# Patient Record
Sex: Female | Born: 1960 | Race: White | Hispanic: No | Smoking: Current every day smoker
Health system: Southern US, Community
[De-identification: ages and names within clinical notes are randomized; demographics above are authoritative.]

## PROBLEM LIST (undated history)

## (undated) DIAGNOSIS — J449 Chronic obstructive pulmonary disease, unspecified: Secondary | ICD-10-CM

## (undated) DIAGNOSIS — G43909 Migraine, unspecified, not intractable, without status migrainosus: Secondary | ICD-10-CM

## (undated) DIAGNOSIS — G8929 Other chronic pain: Secondary | ICD-10-CM

## (undated) DIAGNOSIS — K859 Acute pancreatitis without necrosis or infection, unspecified: Secondary | ICD-10-CM

## (undated) DIAGNOSIS — I10 Essential (primary) hypertension: Secondary | ICD-10-CM

## (undated) DIAGNOSIS — K219 Gastro-esophageal reflux disease without esophagitis: Secondary | ICD-10-CM

## (undated) DIAGNOSIS — M1990 Unspecified osteoarthritis, unspecified site: Secondary | ICD-10-CM

## (undated) DIAGNOSIS — E785 Hyperlipidemia, unspecified: Secondary | ICD-10-CM

## (undated) DIAGNOSIS — M549 Dorsalgia, unspecified: Secondary | ICD-10-CM

## (undated) DIAGNOSIS — F419 Anxiety disorder, unspecified: Secondary | ICD-10-CM

## (undated) DIAGNOSIS — M797 Fibromyalgia: Secondary | ICD-10-CM

## (undated) DIAGNOSIS — K8020 Calculus of gallbladder without cholecystitis without obstruction: Secondary | ICD-10-CM

## (undated) DIAGNOSIS — F32A Depression, unspecified: Secondary | ICD-10-CM

## (undated) DIAGNOSIS — F329 Major depressive disorder, single episode, unspecified: Secondary | ICD-10-CM

## (undated) HISTORY — PX: WISDOM TOOTH EXTRACTION: SHX21

## (undated) HISTORY — DX: Acute pancreatitis without necrosis or infection, unspecified: K85.90

## (undated) HISTORY — DX: Essential (primary) hypertension: I10

## (undated) HISTORY — DX: Gastro-esophageal reflux disease without esophagitis: K21.9

## (undated) HISTORY — DX: Chronic obstructive pulmonary disease, unspecified: J44.9

## (undated) HISTORY — DX: Anxiety disorder, unspecified: F41.9

## (undated) HISTORY — DX: Calculus of gallbladder without cholecystitis without obstruction: K80.20

## (undated) HISTORY — DX: Hyperlipidemia, unspecified: E78.5

## (undated) HISTORY — DX: Fibromyalgia: M79.7

## (undated) HISTORY — PX: CHOLECYSTECTOMY: SHX55

---

## 2015-10-17 ENCOUNTER — Emergency Department (HOSPITAL_COMMUNITY)
Admission: EM | Admit: 2015-10-17 | Discharge: 2015-10-17 | Disposition: A | Discharge status: Home / Self Care | Payer: Medicaid Other | Attending: Emergency Medicine | Admitting: Emergency Medicine

## 2015-10-17 ENCOUNTER — Encounter (HOSPITAL_COMMUNITY): Payer: Self-pay | Admitting: Emergency Medicine

## 2015-10-17 ENCOUNTER — Emergency Department (HOSPITAL_COMMUNITY)
Admission: EM | Admit: 2015-10-17 | Discharge: 2015-10-17 | Discharge status: Against Medical Advice | Payer: Medicaid Other | Attending: Emergency Medicine | Admitting: Emergency Medicine

## 2015-10-17 DIAGNOSIS — E119 Type 2 diabetes mellitus without complications: Secondary | ICD-10-CM | POA: Diagnosis not present

## 2015-10-17 DIAGNOSIS — F329 Major depressive disorder, single episode, unspecified: Secondary | ICD-10-CM | POA: Diagnosis not present

## 2015-10-17 DIAGNOSIS — F17200 Nicotine dependence, unspecified, uncomplicated: Secondary | ICD-10-CM | POA: Diagnosis not present

## 2015-10-17 DIAGNOSIS — H53149 Visual discomfort, unspecified: Secondary | ICD-10-CM | POA: Diagnosis not present

## 2015-10-17 DIAGNOSIS — I10 Essential (primary) hypertension: Secondary | ICD-10-CM | POA: Insufficient documentation

## 2015-10-17 DIAGNOSIS — Z7982 Long term (current) use of aspirin: Secondary | ICD-10-CM | POA: Diagnosis not present

## 2015-10-17 DIAGNOSIS — R51 Headache: Secondary | ICD-10-CM | POA: Insufficient documentation

## 2015-10-17 DIAGNOSIS — M1990 Unspecified osteoarthritis, unspecified site: Secondary | ICD-10-CM | POA: Insufficient documentation

## 2015-10-17 DIAGNOSIS — R110 Nausea: Secondary | ICD-10-CM | POA: Diagnosis not present

## 2015-10-17 DIAGNOSIS — R519 Headache, unspecified: Secondary | ICD-10-CM

## 2015-10-17 DIAGNOSIS — Z7984 Long term (current) use of oral hypoglycemic drugs: Secondary | ICD-10-CM | POA: Diagnosis not present

## 2015-10-17 DIAGNOSIS — Z79899 Other long term (current) drug therapy: Secondary | ICD-10-CM | POA: Insufficient documentation

## 2015-10-17 HISTORY — DX: Unspecified osteoarthritis, unspecified site: M19.90

## 2015-10-17 HISTORY — DX: Depression, unspecified: F32.A

## 2015-10-17 HISTORY — DX: Essential (primary) hypertension: I10

## 2015-10-17 HISTORY — DX: Major depressive disorder, single episode, unspecified: F32.9

## 2015-10-17 MED ORDER — DIPHENHYDRAMINE HCL 50 MG/ML IJ SOLN
25.0000 mg | Freq: Once | INTRAMUSCULAR | Status: AC
  Administered 2015-10-17: 25 mg via INTRAVENOUS
  Filled 2015-10-17: qty 1

## 2015-10-17 MED ORDER — KETOROLAC TROMETHAMINE 30 MG/ML IJ SOLN
30.0000 mg | Freq: Once | INTRAMUSCULAR | Status: DC
  Filled 2015-10-17: qty 1

## 2015-10-17 MED ORDER — ONDANSETRON 8 MG PO TBDP
8.0000 mg | ORAL_TABLET | Freq: Once | ORAL | Status: AC
  Administered 2015-10-17: 8 mg via ORAL
  Filled 2015-10-17: qty 1

## 2015-10-17 MED ORDER — KETOROLAC TROMETHAMINE 30 MG/ML IJ SOLN
30.0000 mg | Freq: Once | INTRAMUSCULAR | Status: AC
  Administered 2015-10-17: 30 mg via INTRAVENOUS

## 2015-10-17 NOTE — ED Notes (Signed)
Pt left AMA.  Decided she did not want to wait.

## 2015-10-17 NOTE — Discharge Instructions (Signed)

## 2015-10-17 NOTE — ED Provider Notes (Signed)
CSN: [id number]     Arrival date & time 10/17/15  1908 History   First MD Initiated Contact with Patient 10/17/15 2035     Chief Complaint  Patient presents with  . Headache     (Consider location/radiation/quality/duration/timing/severity/associated sxs/prior Treatment) HPI Comments: Patient presents to the emergency department with chief complaint of headache. She reports history of migraine. States that she has had a slowly worsening headache for the past 2 days. She states that she normally takes Excedrin Migraine at home, but has not tried that for this headache. She reports associated nausea, photophobia, and phonophobia. She denies any vomiting. She sustained numbness, weakness, or tingling. There are no other associated symptoms. She is new to the area and has her first appointment with a primary care provider tomorrow.  The history is provided by the patient. No language interpreter was used.    Past Medical History  Diagnosis Date  . Hypertension   . Diabetes mellitus without complication ([place])   . Depression   . DJD (degenerative joint disease)    Past Surgical History  Procedure Laterality Date  . Cholecystectomy     Family History  Problem Relation Age of Onset  . Diabetes Mother   . Hypertension Mother   . Anemia Mother   . Cancer Father   . Cancer Brother    Social History  Substance Use Topics  . Smoking status: Current Every Day Smoker  . Smokeless tobacco: None  . Alcohol Use: No   OB History    No data available     Review of Systems  Constitutional: Negative for fever and chills.  Respiratory: Negative for shortness of breath.   Cardiovascular: Negative for chest pain.  Gastrointestinal: Negative for nausea, vomiting, diarrhea and constipation.  Genitourinary: Negative for dysuria.  Neurological: Positive for headaches.  All other systems reviewed and are negative.     Allergies  Review of patient's allergies indicates no known  allergies.  Home Medications   Prior to Admission medications   Medication Sig Start Date End Date Taking? Authorizing Provider  albuterol (PROVENTIL HFA;VENTOLIN HFA) 108 (90 BASE) MCG/ACT inhaler Inhale 2 puffs into the lungs 4 (four) times daily.   Yes Historical Provider, MD  aspirin EC 81 MG tablet Take 81 mg by mouth daily.   Yes Historical Provider, MD  atorvastatin (LIPITOR) 10 MG tablet Take 10 mg by mouth daily.   Yes Historical Provider, MD  busPIRone (BUSPAR) 15 MG tablet Take 15 mg by mouth 3 (three) times daily.   Yes Historical Provider, MD  cholecalciferol (VITAMIN D) 1000 UNITS tablet Take 2,000 Units by mouth daily.   Yes Historical Provider, MD  DULoxetine (CYMBALTA) 60 MG capsule Take 60 mg by mouth 2 (two) times daily.   Yes Historical Provider, MD  furosemide (LASIX) 40 MG tablet Take 40 mg by mouth daily.   Yes Historical Provider, MD  gabapentin (NEURONTIN) 600 MG tablet Take 600 mg by mouth 3 (three) times daily.   Yes Historical Provider, MD  lisinopril (PRINIVIL,ZESTRIL) 10 MG tablet Take 10 mg by mouth daily.   Yes Historical Provider, MD  Melatonin 3 MG TABS Take by mouth at bedtime as needed (SLEEP).   Yes Historical Provider, MD  metFORMIN (GLUCOPHAGE) 500 MG tablet Take 500 mg by mouth 2 (two) times daily with a meal.   Yes Historical Provider, MD  omeprazole (PRILOSEC) 20 MG capsule Take 1 capsule by mouth daily. 09/24/15  Yes Historical Provider, MD  potassium chloride SA (  K-DUR,KLOR-CON) 20 MEQ tablet Take 20 mEq by mouth daily.   Yes Historical Provider, MD  rizatriptan (MAXALT) 10 MG tablet Take 5 mg by mouth once as needed for migraine. May repeat in 2 hours if needed   Yes Historical Provider, MD   BP 115/66 mmHg  Pulse 90  Temp(Src) 98.3 F (36.8 C) (Oral)  Resp 18  Ht 5\' 5"  (1.651 m)  Wt 134.265 kg  BMI 49.26 kg/m2  SpO2 95% Physical Exam  Constitutional: She is oriented to person, place, and time. She appears well-developed and well-nourished.   HENT:  Head: Normocephalic and atraumatic.  Right Ear: External ear normal.  Left Ear: External ear normal.  Eyes: Conjunctivae and EOM are normal. Pupils are equal, round, and reactive to light.  Neck: Normal range of motion. Neck supple.  No pain with neck flexion, no meningismus  Cardiovascular: Normal rate, regular rhythm and normal heart sounds.  Exam reveals no gallop and no friction rub.   No murmur heard. Pulmonary/Chest: Effort normal and breath sounds normal. No respiratory distress. She has no wheezes. She has no rales. She exhibits no tenderness.  Abdominal: Soft. She exhibits no distension and no mass. There is no tenderness. There is no rebound and no guarding.  Musculoskeletal: Normal range of motion. She exhibits no edema or tenderness.  Normal gait.  Neurological: She is alert and oriented to person, place, and time. She has normal reflexes.  CN 3-12 intact, normal finger to nose, no pronator drift, sensation and strength intact bilaterally.  Skin: Skin is warm and dry.  Psychiatric: She has a normal mood and affect. Her behavior is normal. Judgment and thought content normal.  Nursing note and vitals reviewed.   ED Course  Procedures (including critical care time)   MDM   Final diagnoses:  Acute nonintractable headache, unspecified headache type    Pt HA treated and improved while in ED.  Presentation is like pts typical HA and non concerning for [facility], ICH, Meningitis, or temporal arteritis. Pt is afebrile with no focal neuro deficits, nuchal rigidity, or change in vision. Pt is to follow up with PCP to discuss prophylactic medication. Pt verbalizes understanding and is agreeable with plan to dc.      Montine Circle, PA-C 10/17/15 [place], DO 10/17/15 2240

## 2015-10-17 NOTE — ED Notes (Signed)
Pt states she has a headache that started 2 days ago  Pt states her jaws hurt, the back of her head hurts, her eyes burn and she has nausea  Pt denies hx of same  Pt states her vision has been blurry

## 2015-10-17 NOTE — ED Notes (Signed)
Pt reports headache x 2 days, prior hx of migraine.  Usually treats at home w//excedrin migraine.  Pt endorses nausea, flashing lights, and ringing in the ears.  No aura.

## 2015-10-17 NOTE — ED Notes (Signed)
Pt sts HA x 2 days worse today in back of head

## 2015-12-27 ENCOUNTER — Other Ambulatory Visit (HOSPITAL_COMMUNITY): Payer: Self-pay | Admitting: Obstetrics

## 2015-12-27 DIAGNOSIS — N83209 Unspecified ovarian cyst, unspecified side: Secondary | ICD-10-CM

## 2016-01-03 ENCOUNTER — Ambulatory Visit (HOSPITAL_COMMUNITY)
Admission: RE | Admit: 2016-01-03 | Discharge: 2016-01-03 | Disposition: A | Discharge status: Home / Self Care | Payer: Medicaid Other | Source: Ambulatory Visit | Attending: Obstetrics | Admitting: Obstetrics

## 2016-01-03 DIAGNOSIS — N83209 Unspecified ovarian cyst, unspecified side: Secondary | ICD-10-CM

## 2016-01-03 DIAGNOSIS — N83291 Other ovarian cyst, right side: Secondary | ICD-10-CM | POA: Insufficient documentation

## 2016-01-03 DIAGNOSIS — N950 Postmenopausal bleeding: Secondary | ICD-10-CM | POA: Insufficient documentation

## 2016-02-29 ENCOUNTER — Other Ambulatory Visit (HOSPITAL_COMMUNITY): Payer: Self-pay | Admitting: Obstetrics

## 2016-02-29 DIAGNOSIS — N83202 Unspecified ovarian cyst, left side: Secondary | ICD-10-CM

## 2016-03-07 ENCOUNTER — Ambulatory Visit (HOSPITAL_COMMUNITY)
Admission: RE | Admit: 2016-03-07 | Discharge: 2016-03-07 | Disposition: A | Discharge status: Home / Self Care | Payer: Medicaid Other | Source: Ambulatory Visit | Attending: Obstetrics | Admitting: Obstetrics

## 2016-03-07 DIAGNOSIS — N83202 Unspecified ovarian cyst, left side: Secondary | ICD-10-CM | POA: Diagnosis not present

## 2016-03-07 DIAGNOSIS — Z780 Asymptomatic menopausal state: Secondary | ICD-10-CM | POA: Insufficient documentation

## 2016-03-07 DIAGNOSIS — N7011 Chronic salpingitis: Secondary | ICD-10-CM | POA: Insufficient documentation

## 2016-03-29 ENCOUNTER — Emergency Department (HOSPITAL_COMMUNITY)
Admission: EM | Admit: 2016-03-29 | Discharge: 2016-03-29 | Disposition: A | Discharge status: Home / Self Care | Payer: Medicaid Other | Attending: Emergency Medicine | Admitting: Emergency Medicine

## 2016-03-29 ENCOUNTER — Encounter (HOSPITAL_COMMUNITY): Payer: Self-pay | Admitting: Emergency Medicine

## 2016-03-29 DIAGNOSIS — Z7982 Long term (current) use of aspirin: Secondary | ICD-10-CM | POA: Insufficient documentation

## 2016-03-29 DIAGNOSIS — W268XXA Contact with other sharp object(s), not elsewhere classified, initial encounter: Secondary | ICD-10-CM | POA: Insufficient documentation

## 2016-03-29 DIAGNOSIS — E119 Type 2 diabetes mellitus without complications: Secondary | ICD-10-CM | POA: Insufficient documentation

## 2016-03-29 DIAGNOSIS — F17200 Nicotine dependence, unspecified, uncomplicated: Secondary | ICD-10-CM | POA: Diagnosis not present

## 2016-03-29 DIAGNOSIS — S99922A Unspecified injury of left foot, initial encounter: Secondary | ICD-10-CM | POA: Diagnosis present

## 2016-03-29 DIAGNOSIS — Y939 Activity, unspecified: Secondary | ICD-10-CM | POA: Diagnosis not present

## 2016-03-29 DIAGNOSIS — Z7984 Long term (current) use of oral hypoglycemic drugs: Secondary | ICD-10-CM | POA: Diagnosis not present

## 2016-03-29 DIAGNOSIS — I10 Essential (primary) hypertension: Secondary | ICD-10-CM | POA: Insufficient documentation

## 2016-03-29 DIAGNOSIS — F329 Major depressive disorder, single episode, unspecified: Secondary | ICD-10-CM | POA: Diagnosis not present

## 2016-03-29 DIAGNOSIS — Z79899 Other long term (current) drug therapy: Secondary | ICD-10-CM | POA: Insufficient documentation

## 2016-03-29 DIAGNOSIS — Y929 Unspecified place or not applicable: Secondary | ICD-10-CM | POA: Insufficient documentation

## 2016-03-29 DIAGNOSIS — Y999 Unspecified external cause status: Secondary | ICD-10-CM | POA: Insufficient documentation

## 2016-03-29 DIAGNOSIS — Z79891 Long term (current) use of opiate analgesic: Secondary | ICD-10-CM | POA: Diagnosis not present

## 2016-03-29 DIAGNOSIS — S91312A Laceration without foreign body, left foot, initial encounter: Secondary | ICD-10-CM

## 2016-03-29 MED ORDER — CEPHALEXIN 500 MG PO CAPS: 500.0000 mg | ORAL_CAPSULE | Freq: Three times a day (TID) | ORAL | Status: DC

## 2016-03-29 NOTE — ED Provider Notes (Signed)
CSN: [id number]     Arrival date & time 03/29/16  1820 History   By signing my name below, I, Doran Stabler, attest that this documentation has been prepared under the direction and in the presence of Calbert Hulsebus Y Glennon Kopko, [place]. Electronically Signed: Doran Stabler, ED Scribe. 03/29/2016. 7:56 PM.  Chief Complaint  Patient presents with  . Foot Injury   The history is provided by the patient. No language interpreter was used.   HPI Comments: Jenna Wood is a 55 y.o. female with a PMHx of HTN and DM who presents to the Emergency Department complaining of left foot injury s/p shaving a callus 11 am this morning. Pt states she was shaving a callus to the lateral aspect of her left foot when he states she did realize how much she shaved off and began bleeding. Since then, her laceration has has been "oozing" blood. Pt denies any fevers, nausea, vomiting, or any other symptoms at this time. Pt states she is UTD on tetanus.  Past Medical History  Diagnosis Date  . Hypertension   . Diabetes mellitus without complication ([place])   . Depression   . DJD (degenerative joint disease)    Past Surgical History  Procedure Laterality Date  . Cholecystectomy     Family History  Problem Relation Age of Onset  . Diabetes Mother   . Hypertension Mother   . Anemia Mother   . Cancer Father   . Cancer Brother    Social History  Substance Use Topics  . Smoking status: Current Every Day Smoker  . Smokeless tobacco: None  . Alcohol Use: No   OB History    No data available     Review of Systems  Constitutional: Negative for fever.  Gastrointestinal: Negative for nausea and vomiting.  Skin: Positive for wound.    Allergies  Review of patient's allergies indicates no known allergies.  Home Medications   Prior to Admission medications   Medication Sig Start Date End Date Taking? Authorizing Provider  acetaminophen-codeine (TYLENOL #4) 300-60 MG tablet Take 1 tablet by mouth 4 (four) times daily.  03/12/16  Yes Historical Provider, MD  albuterol (PROVENTIL HFA;VENTOLIN HFA) 108 (90 BASE) MCG/ACT inhaler Inhale 2 puffs into the lungs 4 (four) times daily.   Yes Historical Provider, MD  aspirin EC 81 MG tablet Take 81 mg by mouth daily.   Yes Historical Provider, MD  atorvastatin (LIPITOR) 10 MG tablet Take 10 mg by mouth daily.   Yes Historical Provider, MD  budesonide-formoterol (SYMBICORT) 160-4.5 MCG/ACT inhaler Inhale 2 puffs into the lungs 2 (two) times daily.   Yes Historical Provider, MD  busPIRone (BUSPAR) 15 MG tablet Take 15 mg by mouth 3 (three) times daily.   Yes Historical Provider, MD  CHANTIX 1 MG tablet Take 1 mg by mouth 2 (two) times daily. 03/13/16  Yes Historical Provider, MD  cholecalciferol (VITAMIN D) 1000 UNITS tablet Take 2,000 Units by mouth daily.   Yes Historical Provider, MD  cyclobenzaprine (FLEXERIL) 10 MG tablet Take 10 mg by mouth at bedtime. 03/11/16  Yes Historical Provider, MD  DULoxetine (CYMBALTA) 60 MG capsule Take 60 mg by mouth 2 (two) times daily.   Yes Historical Provider, MD  furosemide (LASIX) 40 MG tablet Take 40 mg by mouth daily.   Yes Historical Provider, MD  lisinopril (PRINIVIL,ZESTRIL) 10 MG tablet Take 10 mg by mouth daily. 03/13/16  Yes Historical Provider, MD  LYRICA 150 MG capsule Take 150 mg by mouth 2 (two) times  daily. 03/12/16  Yes Historical Provider, MD  Melatonin 5 MG CAPS Take 5 mg by mouth at bedtime.   Yes Historical Provider, MD  metFORMIN (GLUCOPHAGE) 500 MG tablet Take 500 mg by mouth 2 (two) times daily with a meal.   Yes Historical Provider, MD  omeprazole (PRILOSEC) 20 MG capsule Take 20 mg by mouth daily.  09/24/15  Yes Historical Provider, MD  pentoxifylline (TRENTAL) 400 MG CR tablet Take 400 mg by mouth 3 (three) times daily. 03/13/16  Yes Historical Provider, MD  phentermine (ADIPEX-P) 37.5 MG tablet Take 37.5 mg by mouth every morning. 03/25/16  Yes Historical Provider, MD  potassium chloride SA (K-DUR,KLOR-CON) 20 MEQ tablet  Take 20 mEq by mouth daily.   Yes Historical Provider, MD  traMADol (ULTRAM) 50 MG tablet Take 100 mg by mouth every 6 (six) hours as needed for moderate pain or severe pain.   Yes Historical Provider, MD  traZODone (DESYREL) 50 MG tablet Take 50 mg by mouth at bedtime. 03/13/16  Yes Historical Provider, MD   BP 118/76 mmHg  Pulse 86  Temp(Src) 98.7 F (37.1 C) (Oral)  Resp 16  SpO2 94%   Physical Exam  Constitutional: She is oriented to person, place, and time. She appears well-developed and well-nourished.  HENT:  Head: Normocephalic and atraumatic.  Cardiovascular: Normal rate.   Pulmonary/Chest: Effort normal.  Musculoskeletal:  Superficial laceration/skin avulsion injury to left lateral foot. ~1cm. Bleeding now well controlled. No foreign bodies visualized or palpated.  Neurological: She is alert and oriented to person, place, and time.  Skin: Skin is warm and dry.  Psychiatric: She has a normal mood and affect.  Nursing note and vitals reviewed.   ED Course  Procedures  DIAGNOSTIC STUDIES: Oxygen Saturation is 94% on room air, normal by my interpretation.    COORDINATION OF CARE: 7:54 PM Will provide wound care. Discussed treatment plan with pt at bedside and pt agreed to plan.  MDM   Final diagnoses:  Foot laceration, left, initial encounter    Bleeding well controlled at this time. Wound cleaned and dressed. Discussed wound care with pt. Given history of DM will cover with a week of keflex. Pt declines rx for pain meds. I encouraged her to see podiatrist as needed for foot care given her neuropathy. Instructed PCP follow up. ER return precautions given.   I personally performed the services described in this documentation, which was scribed in my presence. The recorded information has been reviewed and is accurate.   Anne Ng, PA-C 03/29/16 2149  Forde Dandy, MD 03/31/16 2045

## 2016-03-29 NOTE — Discharge Instructions (Signed)
Wound Care °Taking care of your wound properly can help to prevent pain and infection. It can also help your wound to heal more quickly.  °HOW TO CARE FOR YOUR WOUND  °· Take or apply over-the-counter and prescription medicines only as told by your health care provider. °· If you were prescribed antibiotic medicine, take or apply it as told by your health care provider. Do not stop using the antibiotic even if your condition improves. °· Clean the wound each day or as told by your health care provider. °¨ Wash the wound with mild soap and water. °¨ Rinse the wound with water to remove all soap. °¨ Pat the wound dry with a clean towel. Do not rub it. °· There are many different ways to close and cover a wound. For example, a wound can be covered with stitches (sutures), skin glue, or adhesive strips. Follow instructions from your health care provider about: °¨ How to take care of your wound. °¨ When and how you should change your bandage (dressing). °¨ When you should remove your dressing. °¨ Removing whatever was used to close your wound. °· Check your wound every day for signs of infection. Watch for: °¨ Redness, swelling, or pain. °¨ Fluid, blood, or pus. °· Keep the dressing dry until your health care provider says it can be removed. Do not take baths, swim, use a hot tub, or do anything that would put your wound underwater until your health care provider approves. °· Raise (elevate) the injured area above the level of your heart while you are sitting or lying down. °· Do not scratch or pick at the wound. °· Keep all follow-up visits as told by your health care provider. This is important. °SEEK MEDICAL CARE IF: °· You received a tetanus shot and you have swelling, severe pain, redness, or bleeding at the injection site. °· You have a fever. °· Your pain is not controlled with medicine. °· You have increased redness, swelling, or pain at the site of your wound. °· You have fluid, blood, or pus coming from your  wound. °· You notice a bad smell coming from your wound or your dressing. °SEEK IMMEDIATE MEDICAL CARE IF: °· You have a red streak going away from your wound. °  °This information is not intended to replace advice given to you by your health care provider. Make sure you discuss any questions you have with your health care provider. °  °Document Released: 08/05/2008 Document Revised: 03/13/2015 Document Reviewed: 10/23/2014 °Elsevier Interactive Patient Education ©2016 [employer] ° °

## 2016-03-29 NOTE — ED Notes (Signed)
Cleaned left foot with  Normal saline and applied gauze dressing. Pt educated on s/s of infection. Also education given to be aware of increased blood sugar. Encourage to f/u with MD as instructed.

## 2016-03-29 NOTE — ED Notes (Addendum)
Pt cut lateral side of L foot while shaving this am. Pt has an abrasion that continued to ooze all day. No lacerations. Pt takes medication that increases peripheral blood circulation and is diabetic.

## 2016-06-21 ENCOUNTER — Emergency Department (HOSPITAL_COMMUNITY)
Admission: EM | Admit: 2016-06-21 | Discharge: 2016-06-21 | Disposition: A | Discharge status: Home / Self Care | Payer: Medicaid Other | Attending: Emergency Medicine | Admitting: Emergency Medicine

## 2016-06-21 ENCOUNTER — Encounter (HOSPITAL_COMMUNITY): Payer: Self-pay | Admitting: Emergency Medicine

## 2016-06-21 DIAGNOSIS — Z79899 Other long term (current) drug therapy: Secondary | ICD-10-CM | POA: Diagnosis not present

## 2016-06-21 DIAGNOSIS — Z7984 Long term (current) use of oral hypoglycemic drugs: Secondary | ICD-10-CM | POA: Diagnosis not present

## 2016-06-21 DIAGNOSIS — Z7982 Long term (current) use of aspirin: Secondary | ICD-10-CM | POA: Insufficient documentation

## 2016-06-21 DIAGNOSIS — E119 Type 2 diabetes mellitus without complications: Secondary | ICD-10-CM | POA: Insufficient documentation

## 2016-06-21 DIAGNOSIS — G43809 Other migraine, not intractable, without status migrainosus: Secondary | ICD-10-CM

## 2016-06-21 DIAGNOSIS — F17200 Nicotine dependence, unspecified, uncomplicated: Secondary | ICD-10-CM | POA: Diagnosis not present

## 2016-06-21 DIAGNOSIS — I10 Essential (primary) hypertension: Secondary | ICD-10-CM | POA: Diagnosis not present

## 2016-06-21 LAB — I-STAT CHEM 8, ED
BUN: 6 mg/dL (ref 6–20)
CREATININE: 0.8 mg/dL (ref 0.44–1.00)
Calcium, Ion: 1.09 mmol/L — ABNORMAL LOW (ref 1.13–1.30)
Chloride: 100 mmol/L — ABNORMAL LOW (ref 101–111)
Glucose, Bld: 92 mg/dL (ref 65–99)
HEMATOCRIT: 45 % (ref 36.0–46.0)
HEMOGLOBIN: 15.3 g/dL — AB (ref 12.0–15.0)
POTASSIUM: 3.6 mmol/L (ref 3.5–5.1)
SODIUM: 143 mmol/L (ref 135–145)
TCO2: 30 mmol/L (ref 0–100)

## 2016-06-21 MED ORDER — SODIUM CHLORIDE 0.9 % IV BOLUS (SEPSIS)
1000.0000 mL | Freq: Once | INTRAVENOUS | Status: AC
  Administered 2016-06-21: 1000 mL via INTRAVENOUS

## 2016-06-21 MED ORDER — DIPHENHYDRAMINE HCL 50 MG/ML IJ SOLN
25.0000 mg | Freq: Once | INTRAMUSCULAR | Status: AC
  Administered 2016-06-21: 25 mg via INTRAVENOUS
  Filled 2016-06-21: qty 1

## 2016-06-21 MED ORDER — PROCHLORPERAZINE EDISYLATE 5 MG/ML IJ SOLN
10.0000 mg | Freq: Once | INTRAMUSCULAR | Status: AC
  Administered 2016-06-21: 10 mg via INTRAVENOUS
  Filled 2016-06-21: qty 2

## 2016-06-21 MED ORDER — KETOROLAC TROMETHAMINE 15 MG/ML IJ SOLN
15.0000 mg | Freq: Once | INTRAMUSCULAR | Status: AC
  Administered 2016-06-21: 15 mg via INTRAVENOUS
  Filled 2016-06-21: qty 1

## 2016-06-21 NOTE — ED Triage Notes (Signed)
Patient presents for migraine x3 days. History of same. C/o sensitivity to light and sound, nausea. Denies emesis, blurred or double vision.
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
## 2016-08-02 ENCOUNTER — Encounter: Payer: Self-pay | Admitting: *Deleted

## 2017-04-22 ENCOUNTER — Other Ambulatory Visit (HOSPITAL_BASED_OUTPATIENT_CLINIC_OR_DEPARTMENT_OTHER): Payer: Self-pay

## 2017-04-22 DIAGNOSIS — R0683 Snoring: Secondary | ICD-10-CM

## 2017-05-05 ENCOUNTER — Ambulatory Visit: Payer: Medicare Other

## 2017-08-04 ENCOUNTER — Encounter (INDEPENDENT_AMBULATORY_CARE_PROVIDER_SITE_OTHER): Payer: Self-pay

## 2017-08-04 ENCOUNTER — Ambulatory Visit: Payer: Medicare Other | Attending: Specialist | Admitting: Neurology

## 2017-08-04 DIAGNOSIS — R0683 Snoring: Secondary | ICD-10-CM | POA: Insufficient documentation

## 2017-08-04 DIAGNOSIS — G4733 Obstructive sleep apnea (adult) (pediatric): Secondary | ICD-10-CM

## 2017-08-04 DIAGNOSIS — G4761 Periodic limb movement disorder: Secondary | ICD-10-CM | POA: Diagnosis not present

## 2017-08-15 NOTE — Procedures (Signed)
[place]earns A. Merlene Laughter, MD     [email or web address]             NOCTURNAL POLYSOMNOGRAPHY   LOCATION: [facility]   Patient Name: Jenna Wood, Jenna Wood Date: 08/04/2017 Gender: Female D.O.B: [birth year] Age (years): 56 Referring Provider: Lillia Corporal Height (inches): 65 Interpreting Physician: Phillips Odor MD, ABSM Weight (lbs): 267 RPSGT: Rosebud Poles BMI: 44 MRN: [id number] Neck Size: 17.50 CLINICAL INFORMATION Sleep Study Type: NPSG  Indication for sleep study: Snoring  Epworth Sleepiness Score: 17  SLEEP STUDY TECHNIQUE As per the AASM Manual for the Scoring of Sleep and Associated Events v2.3 (April 2016) with a hypopnea requiring 4% desaturations.  The channels recorded and monitored were frontal, central and occipital EEG, electrooculogram (EOG), submentalis EMG (chin), nasal and oral airflow, thoracic and abdominal wall motion, anterior tibialis EMG, snore microphone, electrocardiogram, and pulse oximetry.  MEDICATIONS Medications self-administered by patient taken the night of the study : PENTOXIFYLLINE, DULOXETINE, ATORVASTATIN, CYCLOBENZAPRINE HCL, OMEPRAZOLE, ACETAMINOPHEN-HYDROCODONE, TRAZODONE, Lyrica, BUSPIRONE HCL  Current Outpatient Prescriptions:  .  albuterol (PROVENTIL HFA;VENTOLIN HFA) 108 (90 BASE) MCG/ACT inhaler, Inhale 2 puffs into the lungs every 4 (four) hours as needed for wheezing or shortness of breath. , Disp: , Rfl:  .  aspirin EC 81 MG tablet, Take 81 mg by mouth daily., Disp: , Rfl:  .  atorvastatin (LIPITOR) 10 MG tablet, Take 10 mg by mouth every evening. , Disp: , Rfl:  .  budesonide-formoterol (SYMBICORT) 160-4.5 MCG/ACT inhaler, Inhale 2 puffs into the lungs 2 (two) times daily as needed (wheezing, shortness of breath). , Disp: , Rfl:  .  busPIRone (BUSPAR) 15 MG tablet, Take 15 mg by mouth 3 (three) times daily., Disp: , Rfl:  .  cephALEXin (KEFLEX) 500 MG capsule, Take 1 capsule (500 mg total) by mouth 3  (three) times daily. (Patient not taking: Reported on 06/21/2016), Disp: 21 capsule, Rfl: 0 .  cholecalciferol (VITAMIN D) 1000 UNITS tablet, Take 2,000 Units by mouth daily., Disp: , Rfl:  .  cyclobenzaprine (FLEXERIL) 10 MG tablet, Take 10 mg by mouth at bedtime., Disp: , Rfl: 2 .  DULoxetine (CYMBALTA) 60 MG capsule, Take 60 mg by mouth 2 (two) times daily., Disp: , Rfl:  .  furosemide (LASIX) 40 MG tablet, Take 40 mg by mouth daily., Disp: , Rfl:  .  lisinopril (PRINIVIL,ZESTRIL) 10 MG tablet, Take 10 mg by mouth daily., Disp: , Rfl: 3 .  LYRICA 150 MG capsule, Take 150 mg by mouth 2 (two) times daily., Disp: , Rfl: 2 .  Melatonin 5 MG CAPS, Take 5 mg by mouth at bedtime as needed (sleep). , Disp: , Rfl:  .  metFORMIN (GLUCOPHAGE) 500 MG tablet, Take 500 mg by mouth 2 (two) times daily with a meal., Disp: , Rfl:  .  omeprazole (PRILOSEC) 20 MG capsule, Take 20 mg by mouth every evening. , Disp: , Rfl: 0 .  pentoxifylline (TRENTAL) 400 MG CR tablet, Take 400 mg by mouth 3 (three) times daily., Disp: , Rfl: 3 .  phentermine (ADIPEX-P) 37.5 MG tablet, Take 37.5 mg by mouth every evening. , Disp: , Rfl: 0 .  potassium chloride SA (K-DUR,KLOR-CON) 20 MEQ tablet, Take 20 mEq by mouth daily., Disp: , Rfl:  .  traMADol (ULTRAM) 50 MG tablet, Take 100 mg by mouth every 6 (six) hours as needed for moderate pain or severe pain., Disp: , Rfl:  .  TRAVATAN Z 0.004 % SOLN ophthalmic solution, Place 1 drop into  both eyes at bedtime., Disp: , Rfl: 4 .  traZODone (DESYREL) 50 MG tablet, Take 50 mg by mouth at bedtime., Disp: , Rfl: 1 .  triamcinolone cream (KENALOG) 0.1 %, Apply 1 application topically 2 (two) times daily as needed for rash., Disp: , Rfl: 2   SLEEP ARCHITECTURE The study was initiated at 10:34:45 PM and ended at 5:02:13 AM.  Sleep onset time was 4.8 minutes and the sleep efficiency was 91.3%. The total sleep time was 353.7 minutes.  Stage REM latency was 180.0 minutes.  The patient spent  1.98% of the night in stage N1 sleep, 60.70% in stage N2 sleep, 29.97% in stage N3 and 7.35% in REM.  Alpha intrusion was absent.  Supine sleep was 67.70%.  RESPIRATORY PARAMETERS The overall apnea/hypopnea index (AHI) was 0.3 per hour. There were 0 total apneas, including 0 obstructive, 0 central and 0 mixed apneas. There were 2 hypopneas and 0 RERAs.  The AHI during Stage REM sleep was 4.6 per hour.  AHI while supine was 0.0 per hour.  The mean oxygen saturation was 92.33%. The minimum SpO2 during sleep was 74.00%.  soft snoring was noted during this study.  CARDIAC DATA The 2 lead EKG demonstrated sinus rhythm. The mean heart rate was N/A beats per minute. Other EKG findings include: PVCs. LEG MOVEMENT DATA The total PLMS were 85 with a resulting PLMS index of 14.42. Associated arousal with leg movement index was 1.0.  IMPRESSIONS - No significant obstructive sleep apnea occurred during this study. - No significant central sleep apnea occurred during this study. - Mild periodic limb movements of sleep occurred during the study. No significant associated arousals.  Delano Metz, MD Diplomate, American Board of Sleep Medicine.  ELECTRONICALLY SIGNED ON:  08/15/2017, 9:45 AM [facility] PH: [phone]   FX: [phone] [place]

## 2018-01-10 ENCOUNTER — Other Ambulatory Visit: Payer: Self-pay

## 2018-01-10 ENCOUNTER — Encounter (HOSPITAL_COMMUNITY): Payer: Self-pay | Admitting: *Deleted

## 2018-01-10 ENCOUNTER — Emergency Department (HOSPITAL_COMMUNITY)
Admission: EM | Admit: 2018-01-10 | Discharge: 2018-01-10 | Disposition: A | Discharge status: Home / Self Care | Payer: Medicare HMO | Attending: Emergency Medicine | Admitting: Emergency Medicine

## 2018-01-10 DIAGNOSIS — I10 Essential (primary) hypertension: Secondary | ICD-10-CM | POA: Diagnosis not present

## 2018-01-10 DIAGNOSIS — G43801 Other migraine, not intractable, with status migrainosus: Secondary | ICD-10-CM | POA: Insufficient documentation

## 2018-01-10 DIAGNOSIS — F17210 Nicotine dependence, cigarettes, uncomplicated: Secondary | ICD-10-CM | POA: Insufficient documentation

## 2018-01-10 DIAGNOSIS — Z79899 Other long term (current) drug therapy: Secondary | ICD-10-CM | POA: Diagnosis not present

## 2018-01-10 DIAGNOSIS — R51 Headache: Secondary | ICD-10-CM | POA: Diagnosis present

## 2018-01-10 DIAGNOSIS — E119 Type 2 diabetes mellitus without complications: Secondary | ICD-10-CM | POA: Insufficient documentation

## 2018-01-10 DIAGNOSIS — G43809 Other migraine, not intractable, without status migrainosus: Secondary | ICD-10-CM

## 2018-01-10 HISTORY — DX: Other chronic pain: G89.29

## 2018-01-10 HISTORY — DX: Dorsalgia, unspecified: M54.9

## 2018-01-10 HISTORY — DX: Migraine, unspecified, not intractable, without status migrainosus: G43.909

## 2018-01-10 MED ORDER — PROCHLORPERAZINE EDISYLATE 5 MG/ML IJ SOLN
10.0000 mg | Freq: Once | INTRAMUSCULAR | Status: AC
  Administered 2018-01-10: 10 mg via INTRAMUSCULAR
  Filled 2018-01-10: qty 2

## 2018-01-10 MED ORDER — DIPHENHYDRAMINE HCL 50 MG/ML IJ SOLN
25.0000 mg | Freq: Once | INTRAMUSCULAR | Status: AC
  Administered 2018-01-10: 25 mg via INTRAMUSCULAR
  Filled 2018-01-10: qty 1

## 2018-01-10 MED ORDER — KETOROLAC TROMETHAMINE 60 MG/2ML IM SOLN
60.0000 mg | Freq: Once | INTRAMUSCULAR | Status: AC
  Administered 2018-01-10: 60 mg via INTRAMUSCULAR
  Filled 2018-01-10: qty 2

## 2018-01-10 NOTE — ED Provider Notes (Signed)
[facility] EMERGENCY DEPARTMENT Provider Note   CSN: [id number] Arrival date & time: 01/10/18  0127     History   Chief Complaint Chief Complaint  Patient presents with  . Migraine    HPI Jenna Wood is a 57 y.o. female.  She presents to the ER for evaluation of migraine headache.  She reports that her headache began yesterday.  Pain goes across the sides of her head and is centered behind both of her eyes.  She reports it is a throbbing pain that is associated with nausea, vomiting and light sensitivity.  This is consistent with frequent migraine she has had in the past.  No unusual features.  No numbness, tingling, weakness of extremities.      Past Medical History:  Diagnosis Date  . Chronic back pain   . Depression   . Diabetes mellitus without complication ([place])   . DJD (degenerative joint disease)   . Hypertension   . Migraine     There are no active problems to display for this patient.   Past Surgical History:  Procedure Laterality Date  . CHOLECYSTECTOMY      OB History    No data available       Home Medications    Prior to Admission medications   Medication Sig Start Date End Date Taking? Authorizing Provider  albuterol (PROVENTIL HFA;VENTOLIN HFA) 108 (90 BASE) MCG/ACT inhaler Inhale 2 puffs into the lungs every 4 (four) hours as needed for wheezing or shortness of breath.    Yes [provider]  atorvastatin (LIPITOR) 10 MG tablet Take 10 mg by mouth every evening.    Yes [provider]  busPIRone (BUSPAR) 15 MG tablet Take 15 mg by mouth 3 (three) times daily.   Yes [provider]  cyclobenzaprine (FLEXERIL) 10 MG tablet Take 10 mg by mouth at bedtime. 03/11/16  Yes [provider]  DULoxetine (CYMBALTA) 60 MG capsule Take 60 mg by mouth 2 (two) times daily.   Yes [provider]  furosemide (LASIX) 40 MG tablet Take 40 mg by mouth daily.   Yes [provider]  lisinopril  (PRINIVIL,ZESTRIL) 10 MG tablet Take 10 mg by mouth daily. 03/13/16  Yes [provider]  LYRICA 150 MG capsule Take 150 mg by mouth 2 (two) times daily. 03/12/16  Yes [provider]  pentoxifylline (TRENTAL) 400 MG CR tablet Take 400 mg by mouth 3 (three) times daily. 03/13/16  Yes [provider]  potassium chloride SA (K-DUR,KLOR-CON) 20 MEQ tablet Take 20 mEq by mouth daily.   Yes [provider]  TRAVATAN Z 0.004 % SOLN ophthalmic solution Place 1 drop into both eyes at bedtime. 06/02/16  Yes [provider]  UNABLE TO FIND 2 mg. Med Name: [place]   Yes [provider]    Family History Family History  Problem Relation Age of Onset  . Cancer Father   . Cancer Brother   . Diabetes Mother   . Hypertension Mother   . Anemia Mother     Social History Social History   Tobacco Use  . Smoking status: Current Every Day Smoker    Packs/day: 1.00    Types: Cigarettes  . Smokeless tobacco: Never Used  Substance Use Topics  . Alcohol use: No  . Drug use: No     Allergies   Patient has no known allergies.   Review of Systems Review of Systems  Eyes: Positive for photophobia.  Gastrointestinal: Positive for nausea  and vomiting.  Neurological: Positive for headaches.  All other systems reviewed and are negative.    Physical Exam Updated Vital Signs BP 130/69 (BP Location: Right Arm)   Pulse 82   Temp 97.6 F (36.4 C) (Oral)   Resp 19   Ht 5\' 5"  (1.651 m)   Wt 120.7 kg (266 lb)   SpO2 100%   BMI 44.26 kg/m   Physical Exam  Constitutional: She is oriented to person, place, and time. She appears well-developed and well-nourished. No distress.  HENT:  Head: Normocephalic and atraumatic.  Right Ear: Hearing normal.  Left Ear: Hearing normal.  Nose: Nose normal.  Mouth/Throat: Oropharynx is clear and moist and mucous membranes are normal.  Eyes: Conjunctivae and EOM are normal. Pupils are equal, round, and reactive to  light.  Neck: Normal range of motion. Neck supple.  Cardiovascular: Regular rhythm, S1 normal and S2 normal. Exam reveals no gallop and no friction rub.  No murmur heard. Pulmonary/Chest: Effort normal and breath sounds normal. No respiratory distress. She exhibits no tenderness.  Abdominal: Soft. Normal appearance and bowel sounds are normal. There is no hepatosplenomegaly. There is no tenderness. There is no rebound, no guarding, no tenderness at McBurney's point and negative Murphy's sign. No hernia.  Musculoskeletal: Normal range of motion.  Neurological: She is alert and oriented to person, place, and time. She has normal strength. No cranial nerve deficit or sensory deficit. Coordination normal. GCS eye subscore is 4. GCS verbal subscore is 5. GCS motor subscore is 6.  Skin: Skin is warm, dry and intact. No rash noted. No cyanosis.  Psychiatric: She has a normal mood and affect. Her speech is normal and behavior is normal. Thought content normal.  Nursing note and vitals reviewed.    ED Treatments / Results  Labs (all labs ordered are listed, but only abnormal results are displayed) Labs Reviewed - No data to display  EKG  EKG Interpretation None       Radiology No results found.  Procedures Procedures (including critical care time)  Medications Ordered in ED Medications  ketorolac (TORADOL) injection 60 mg (not administered)  prochlorperazine (COMPAZINE) injection 10 mg (not administered)  diphenhydrAMINE (BENADRYL) injection 25 mg (not administered)     Initial Impression / Assessment and Plan / ED Course  I have reviewed the triage vital signs and the nursing notes.  Pertinent labs & imaging results that were available during my care of the patient were reviewed by me and considered in my medical decision making (see chart for details).     Patient presents to the emergency department for evaluation of migraine headache. Patient is currently experiencing a  headache that is similar to previous migraines. Patient does not have any unusual features compared to previous migraines. Patient has normal neurologic examination. There are no unusual features, such as unusual intensity or sudden onset. As this headache is similar to previous migraines, there is no concern for subarachnoid hemorrhage or other etiology. Patient therefore does not require imaging. Patient treated as migraine headache.  Final Clinical Impressions(s) / ED Diagnoses   Final diagnoses:  Other migraine without status migrainosus, not intractable    ED Discharge Orders    None       Deshanna Kama, Gwenyth Allegra, MD 01/10/18 [phone]

## 2018-01-10 NOTE — ED Triage Notes (Signed)
Pt reports migraine since Friday with n/v and photosensitivity. Pt takes maxalt and has not had any relief. Pt took her last maxalt at around [employer].

## 2018-02-25 ENCOUNTER — Encounter (HOSPITAL_COMMUNITY): Payer: Self-pay | Admitting: *Deleted

## 2018-02-25 ENCOUNTER — Emergency Department (HOSPITAL_COMMUNITY): Payer: Medicare HMO

## 2018-02-25 ENCOUNTER — Emergency Department (HOSPITAL_COMMUNITY)
Admission: EM | Admit: 2018-02-25 | Discharge: 2018-02-25 | Disposition: A | Discharge status: Home / Self Care | Payer: Medicare HMO | Attending: Emergency Medicine | Admitting: Emergency Medicine

## 2018-02-25 ENCOUNTER — Other Ambulatory Visit: Payer: Self-pay

## 2018-02-25 DIAGNOSIS — F17210 Nicotine dependence, cigarettes, uncomplicated: Secondary | ICD-10-CM | POA: Insufficient documentation

## 2018-02-25 DIAGNOSIS — Z79899 Other long term (current) drug therapy: Secondary | ICD-10-CM | POA: Diagnosis not present

## 2018-02-25 DIAGNOSIS — I10 Essential (primary) hypertension: Secondary | ICD-10-CM | POA: Insufficient documentation

## 2018-02-25 DIAGNOSIS — E119 Type 2 diabetes mellitus without complications: Secondary | ICD-10-CM | POA: Diagnosis not present

## 2018-02-25 DIAGNOSIS — M722 Plantar fascial fibromatosis: Secondary | ICD-10-CM | POA: Diagnosis not present

## 2018-02-25 DIAGNOSIS — M79672 Pain in left foot: Secondary | ICD-10-CM | POA: Diagnosis present

## 2018-02-25 MED ORDER — ACETAMINOPHEN 325 MG PO TABS: 650.0000 mg | ORAL_TABLET | Freq: Four times a day (QID) | ORAL | 0 refills | Status: AC | PRN

## 2018-02-25 NOTE — Discharge Instructions (Addendum)
As discussed, follow the instructions provided and apply ice, roll your foot and wear good arch support cushioned shoes.   Take tylenol as needed for pain and follow up with your primary care provider. Return if new concerning symptoms in the meantime.

## 2018-02-25 NOTE — ED Provider Notes (Signed)
[facility] EMERGENCY DEPARTMENT Provider Note   CSN: [id number] Arrival date & time: 02/25/18  1300     History   Chief Complaint Chief Complaint  Patient presents with  . Foot Pain    HPI Jenna Wood is a 57 y.o. female with past medical history significant for depression, diabetes, hypertension presenting with 1 week of left foot pain inferior to the medial malleolus and through the arch of the foot.  Patient reports wearing flip-flops daily.  She denies any numbness or weakness.  Her pain is aggravated by weightbearing and pressure to the affected area. She denies any injury or trauma.  No edema, erythema, warmth, fever or chills.  She has not tried anything for her symptoms.  HPI  Past Medical History:  Diagnosis Date  . Chronic back pain   . Depression   . Diabetes mellitus without complication ([place])   . DJD (degenerative joint disease)   . Hypertension   . Migraine     There are no active problems to display for this patient.   Past Surgical History:  Procedure Laterality Date  . CHOLECYSTECTOMY       OB History   None      Home Medications    Prior to Admission medications   Medication Sig Start Date End Date Taking? Authorizing Provider  acetaminophen (TYLENOL) 325 MG tablet Take 2 tablets (650 mg total) by mouth every 6 (six) hours as needed for mild pain or moderate pain. 02/25/18   Avie Echevaria B, PA-C  albuterol (PROVENTIL HFA;VENTOLIN HFA) 108 (90 BASE) MCG/ACT inhaler Inhale 2 puffs into the lungs every 4 (four) hours as needed for wheezing or shortness of breath.     [provider]  atorvastatin (LIPITOR) 10 MG tablet Take 10 mg by mouth every evening.     [provider]  busPIRone (BUSPAR) 15 MG tablet Take 15 mg by mouth 3 (three) times daily.    [provider]  cyclobenzaprine (FLEXERIL) 10 MG tablet Take 10 mg by mouth at bedtime. 03/11/16   [provider]  DULoxetine (CYMBALTA) 60 MG capsule Take  60 mg by mouth 2 (two) times daily.    [provider]  furosemide (LASIX) 40 MG tablet Take 40 mg by mouth daily.    [provider]  lisinopril (PRINIVIL,ZESTRIL) 10 MG tablet Take 10 mg by mouth daily. 03/13/16   [provider]  LYRICA 150 MG capsule Take 150 mg by mouth 2 (two) times daily. 03/12/16   [provider]  pentoxifylline (TRENTAL) 400 MG CR tablet Take 400 mg by mouth 3 (three) times daily. 03/13/16   [provider]  potassium chloride SA (K-DUR,KLOR-CON) 20 MEQ tablet Take 20 mEq by mouth daily.    [provider]  TRAVATAN Z 0.004 % SOLN ophthalmic solution Place 1 drop into both eyes at bedtime. 06/02/16   [provider]  UNABLE TO FIND 2 mg. Med Name: [place]    [provider]    Family History Family History  Problem Relation Age of Onset  . Cancer Father   . Cancer Brother   . Diabetes Mother   . Hypertension Mother   . Anemia Mother     Social History Social History   Tobacco Use  . Smoking status: Current Every Day Smoker    Packs/day: 1.00    Types: Cigarettes  . Smokeless tobacco: Never Used  Substance Use Topics  . Alcohol use: No  . Drug use: No  Allergies   Patient has no known allergies.   Review of Systems Review of Systems  Constitutional: Negative for chills, diaphoresis and fever.  Respiratory: Negative for cough, chest tightness and shortness of breath.   Cardiovascular: Negative for chest pain and leg swelling.  Gastrointestinal: Negative for nausea and vomiting.  Musculoskeletal: Positive for arthralgias. Negative for gait problem, neck pain and neck stiffness.  Skin: Negative for color change, pallor, rash and wound.  Neurological: Negative for dizziness, weakness, light-headedness, numbness and headaches.  Psychiatric/Behavioral: Negative for behavioral problems.     Physical Exam Updated Vital Signs BP (!) 92/57 (BP Location: Right Arm)   Pulse 81    Temp 98.2 F (36.8 C) (Oral)   Resp 18   Ht 5\' 5"  (1.651 m)   Wt 121.6 kg (268 lb)   SpO2 96%   BMI 44.60 kg/m   Physical Exam  Constitutional: She appears well-developed and well-nourished. No distress.  Afebrile, nontoxic appearing sitting comfortably in bed in no acute distress.  HENT:  Head: Atraumatic.  Eyes: EOM are normal.  Neck: Normal range of motion.  Cardiovascular: Normal rate and intact distal pulses.  Pulmonary/Chest: Effort normal. No respiratory distress.  Musculoskeletal: Normal range of motion. She exhibits tenderness. She exhibits no edema or deformity.  Tender to palpation at the medial arch of the foot and heel pad at the calcaneal tuberosity.  No tenderness to palpation of the malleoli, no edema, erythema, warmth.  Strong dorsalis pedis pulses, full range of motion.  Negative anterior drawer  Neurological: She is alert. No sensory deficit. She exhibits normal muscle tone.  Skin: Skin is warm. Capillary refill takes less than 2 seconds. No rash noted. She is not diaphoretic. No erythema. No pallor.  Psychiatric: She has a normal mood and affect. Her behavior is normal.  Nursing note and vitals reviewed.    ED Treatments / Results  Labs (all labs ordered are listed, but only abnormal results are displayed) Labs Reviewed - No data to display  EKG None  Radiology Dg Foot Complete Left  Result Date: 02/25/2018 CLINICAL DATA:  LEFT foot swelling and pain with ambulation for 2 days. EXAM: LEFT FOOT - COMPLETE 3+ VIEW COMPARISON:  None. FINDINGS: There is no evidence of fracture or dislocation. Bipartite medial tibial sesamoid. Large plantar calcaneal spur. There is no evidence of arthropathy or other focal bone abnormality. Soft tissues are unremarkable. IMPRESSION: No acute osseous process. Large plantar calcaneal spur. Electronically Signed   By: Elon Alas M.D.   On: 02/25/2018 13:56    Procedures Procedures (including critical care  time)  Medications Ordered in ED Medications - No data to display   Initial Impression / Assessment and Plan / ED Course  I have reviewed the triage vital signs and the nursing notes.  Pertinent labs & imaging results that were available during my care of the patient were reviewed by me and considered in my medical decision making (see chart for details).     Patient presenting with left foot pain, ttp at calcaneal tuberosity and midline arch of the left foot. Plain films with significant calcaneal spur.  Patient's hx and physical findings consistent with plantar fasciatis. No evidence of infectious process, no erythema, warmth, edema, fever.  No injury or trauma. Advised patient to wear shoes with arch support and cushioned and provided instructions on stretching and exercises to help alleviate symptoms.  Encouraged patient to discontinue routine wear of flip-flops as footwear.  Will dc home with symptomatic relief  and close follow-up with PCP. Discussed return precautions and patient understood and agreed with plan.  Final Clinical Impressions(s) / ED Diagnoses   Final diagnoses:  Plantar fasciitis of left foot    ED Discharge Orders        Ordered    acetaminophen (TYLENOL) 325 MG tablet  Every 6 hours PRN     02/25/18 1429       Dossie Der 02/25/18 2243    Milton Ferguson, MD 03/02/18 [phone]

## 2018-02-25 NOTE — ED Triage Notes (Signed)
Pt c/o pain with walking to left foot; pt states the pain it to the top of her foot and inside of her ankle

## 2018-03-09 ENCOUNTER — Other Ambulatory Visit: Payer: Self-pay

## 2018-03-09 DIAGNOSIS — I739 Peripheral vascular disease, unspecified: Secondary | ICD-10-CM

## 2018-03-17 ENCOUNTER — Other Ambulatory Visit: Payer: Self-pay

## 2018-03-18 ENCOUNTER — Encounter: Payer: Self-pay | Admitting: Podiatry

## 2018-03-18 ENCOUNTER — Ambulatory Visit (INDEPENDENT_AMBULATORY_CARE_PROVIDER_SITE_OTHER): Payer: Medicare HMO

## 2018-03-18 ENCOUNTER — Ambulatory Visit: Payer: Medicare HMO | Admitting: Podiatry

## 2018-03-18 VITALS — BP 122/82 | HR 88 | Resp 16

## 2018-03-18 DIAGNOSIS — M722 Plantar fascial fibromatosis: Secondary | ICD-10-CM

## 2018-03-18 DIAGNOSIS — M779 Enthesopathy, unspecified: Secondary | ICD-10-CM | POA: Diagnosis not present

## 2018-03-18 DIAGNOSIS — M7662 Achilles tendinitis, left leg: Secondary | ICD-10-CM

## 2018-03-18 MED ORDER — TRIAMCINOLONE ACETONIDE 10 MG/ML IJ SUSP
10.0000 mg | Freq: Once | INTRAMUSCULAR | Status: AC
  Administered 2018-03-18: 10 mg

## 2018-03-18 NOTE — Progress Notes (Signed)
   Subjective:    Patient ID: Jenna Wood, female    DOB: 23-Apr-1961, 57 y.o.   MRN: [id number]  HPI    Review of Systems  All other systems reviewed and are negative.      Objective:   Physical Exam        Assessment & Plan:

## 2018-03-18 NOTE — Progress Notes (Signed)
Subjective:   Patient ID: Jenna Wood, female   DOB: 57 y.o.   MRN: [id number]   HPI Patient presents with exquisite discomfort in the left posterior heel and at the top of the left foot is very sore when pressed with inability to bear significant weight down the foot.  States is been hurting for over a month since she does not remember specific injury and patient does smoke a pack cigarettes per day and is reasonably active   Review of Systems  All other systems reviewed and are negative.       Objective:  Physical Exam  Constitutional: She appears well-developed and well-nourished.  Cardiovascular: Intact distal pulses.  Pulmonary/Chest: Effort normal.  Musculoskeletal: Normal range of motion.  Neurological: She is alert.  Skin: Skin is warm.  Nursing note and vitals reviewed.   Neurovascular status found to be intact muscle strength was adequate range of motion within normal limits with patient found to have exquisite discomfort posterior medial aspect of the left heel near the Achilles tendon but not directly on it and discomfort of the dorsal lateral aspect left foot with inflammation and fluid buildup and swelling of the area.  Patient does have good digital perfusion well oriented x3     Assessment:  Acute Achilles tendinitis left with patient noted to have dorsal tendinitis and inability to bear significant weight on the foot     Plan:  H&P x-rays reviewed condition discussed.  At this time I did a careful medial injection left 3 mg Kenalog 5 mg Xylocaine explaining the chances for rupture associated with this and I went ahead and I carefully injected the dorsal lateral tendon complex 3 mg Kenalog 5 mg Xylocaine and due to the patient the pain that the patient is experiencing I went ahead and I placed into an air fracture walker with instructions on usage and patient will be seen back for [place] to recheck again in the next 3 weeks or earlier if needed  X-rays indicate that  there is spurring of the posterior heel but no indications of stress fracture with mild arthritis not advanced

## 2018-04-08 ENCOUNTER — Ambulatory Visit (INDEPENDENT_AMBULATORY_CARE_PROVIDER_SITE_OTHER): Payer: Medicare HMO | Admitting: Podiatry

## 2018-04-08 ENCOUNTER — Encounter: Payer: Self-pay | Admitting: Podiatry

## 2018-04-08 DIAGNOSIS — M779 Enthesopathy, unspecified: Secondary | ICD-10-CM

## 2018-04-08 DIAGNOSIS — M7662 Achilles tendinitis, left leg: Secondary | ICD-10-CM

## 2018-04-08 MED ORDER — TRIAMCINOLONE ACETONIDE 10 MG/ML IJ SUSP
10.0000 mg | Freq: Once | INTRAMUSCULAR | Status: AC
Start: 2018-04-08 — End: 2018-04-08
  Administered 2018-04-08: 10 mg

## 2018-04-08 NOTE — Progress Notes (Signed)
Subjective:   Patient ID: Jenna Wood, female   DOB: 57 y.o.   MRN: [id number]   HPI Patient states the back of my left heel has improved I am having a lot of pain in my midfoot right and the left midfoot is doing better.  Patient states she still doing exercises for the left neurovascular status intact with exquisite discomfort dorsal aspect right midtarsal joint with fluid buildup   ROS      Objective:  Physical Exam  And on the left I noted there to be diminishment of discomfort in the Achilles tendon with mild swelling still noted     Assessment:  Dorsal tendinitis right midfoot and Achilles tendinitis left improving     Plan:  H&P conditions reviewed and careful injection administered right 3 mg Kenalog 5 mg Xylocaine to reduce the inflammatory process and for the left continue physical therapy continue stretching exercises

## 2018-04-16 ENCOUNTER — Encounter: Payer: Self-pay | Admitting: Vascular Surgery

## 2018-04-16 ENCOUNTER — Ambulatory Visit: Payer: Medicare HMO | Admitting: Vascular Surgery

## 2018-04-16 ENCOUNTER — Other Ambulatory Visit: Payer: Self-pay

## 2018-04-16 ENCOUNTER — Ambulatory Visit (HOSPITAL_COMMUNITY)
Admission: RE | Admit: 2018-04-16 | Discharge: 2018-04-16 | Disposition: A | Discharge status: Home / Self Care | Payer: Medicare HMO | Source: Ambulatory Visit | Attending: Vascular Surgery | Admitting: Vascular Surgery

## 2018-04-16 VITALS — BP 125/80 | HR 78 | Temp 98.8°F | Resp 20 | Ht 65.0 in | Wt 255.0 lb

## 2018-04-16 DIAGNOSIS — M25561 Pain in right knee: Secondary | ICD-10-CM | POA: Diagnosis not present

## 2018-04-16 DIAGNOSIS — M25562 Pain in left knee: Secondary | ICD-10-CM | POA: Diagnosis not present

## 2018-04-16 DIAGNOSIS — I739 Peripheral vascular disease, unspecified: Secondary | ICD-10-CM | POA: Insufficient documentation

## 2018-04-16 NOTE — Progress Notes (Signed)
Patient ID: Jenna Wood, female   DOB: 01/08/1961, 57 y.o.   MRN: [id number]  Reason for Consult: New Patient (Initial Visit) (Pain with walking.  [name], NP.  [phone].)   Referred by [facility]  Subjective:     HPI:  Jenna Wood is a 57 y.o. female with history of bilateral lower extremity pain.  Pain is exacerbated with walking.  She states that it is mostly below her knees but also includes her knees.  It is cramping and burning in nature.  He relieves with rest.  She does not have any tissue loss or ulceration.  She is never had a heart attack or stroke.  From a risk factor standpoint she is hypertensive and hyperlipidemic and is a longtime smoker for 37 years.  She does have pain with standing as well.   She states that she is previously a diabetic but currently not.  Associated symptoms include leg swelling.  She is on a statin drug but does not take any antiplatelets.  Past Medical History:  Diagnosis Date  . Chronic back pain   . Depression   . Diabetes mellitus without complication ([place])   . DJD (degenerative joint disease)   . Hypertension   . Migraine    Family History  Problem Relation Age of Onset  . Cancer Father   . Cancer Brother   . Diabetes Mother   . Hypertension Mother   . Anemia Mother    Past Surgical History:  Procedure Laterality Date  . CHOLECYSTECTOMY      Short Social History:  Social History   Tobacco Use  . Smoking status: Current Every Day Smoker    Packs/day: 1.00    Types: Cigarettes  . Smokeless tobacco: Never Used  Substance Use Topics  . Alcohol use: No    No Known Allergies  Current Outpatient Medications  Medication Sig Dispense Refill  . acetaminophen (TYLENOL) 325 MG tablet Take 2 tablets (650 mg total) by mouth every 6 (six) hours as needed for mild pain or moderate pain. 30 tablet 0  . albuterol (PROVENTIL HFA;VENTOLIN HFA) 108 (90 BASE) MCG/ACT inhaler Inhale 2 puffs into the lungs every 4 (four)  hours as needed for wheezing or shortness of breath.     Marland Kitchen atorvastatin (LIPITOR) 10 MG tablet Take 10 mg by mouth every evening.     Marland Kitchen buPROPion (WELLBUTRIN SR) 100 MG 12 hr tablet TAKE 1 TABLET BY MOUTH EVERY MORNING FOR 1 WEEK THEN INCREASE TO 1 TABLET 2 TIMES DAILY  3  . busPIRone (BUSPAR) 15 MG tablet Take 15 mg by mouth 3 (three) times daily.    . cyclobenzaprine (FLEXERIL) 10 MG tablet Take 10 mg by mouth at bedtime.  2  . DULoxetine (CYMBALTA) 60 MG capsule Take 60 mg by mouth 2 (two) times daily.    . furosemide (LASIX) 40 MG tablet Take 40 mg by mouth daily.    Marland Kitchen lisinopril (PRINIVIL,ZESTRIL) 10 MG tablet Take 10 mg by mouth daily.  3  . LYRICA 150 MG capsule Take 150 mg by mouth 2 (two) times daily.  2  . metFORMIN (GLUCOPHAGE) 500 MG tablet Take 500 mg by mouth 2 (two) times daily.  2  . omeprazole (PRILOSEC) 20 MG capsule Take by mouth daily.  6  . pentoxifylline (TRENTAL) 400 MG CR tablet Take 400 mg by mouth 3 (three) times daily.  3  . potassium chloride SA (K-DUR,KLOR-CON) 20 MEQ tablet Take 20 mEq by  mouth daily.    Marland Kitchen topiramate (TOPAMAX) 50 MG tablet Take 50 mg by mouth 2 (two) times daily.  2  . TRAVATAN Z 0.004 % SOLN ophthalmic solution Place 1 drop into both eyes at bedtime.  4  . UNABLE TO FIND 2 mg. Med Name: [place]     No current facility-administered medications for this visit.     Review of Systems  Constitutional:  Constitutional negative. Respiratory: Positive for cough and shortness of breath.  Cardiovascular: Positive for claudication and leg swelling.  GI: Gastrointestinal negative.  Musculoskeletal: Positive for leg pain.  Skin: Skin negative.  Hematologic: Hematologic/lymphatic negative.  Psychiatric: Psychiatric negative.        Objective:  Objective   Vitals:   04/16/18 0948  BP: 125/80  Pulse: 78  Resp: 20  Temp: 98.8 F (37.1 C)  TempSrc: Oral  SpO2: 96%  Weight: 255 lb (115.7 kg)  Height: 5\' 5"  (1.651 m)   Body mass index is  42.43 kg/m.  Physical Exam  Constitutional: She appears well-developed.  HENT:  Head: Normocephalic.  Eyes: Pupils are equal, round, and reactive to light.  Cardiovascular: Normal rate.  Pulses:      Radial pulses are 2+ on the right side, and 2+ on the left side.       Popliteal pulses are 2+ on the right side, and 2+ on the left side.       Dorsalis pedis pulses are 2+ on the right side, and 2+ on the left side.  Abdominal: Soft. She exhibits no mass.  Musculoskeletal: Normal range of motion. She exhibits no edema.  Neurological: She is alert.  Skin: Skin is warm and dry. Capillary refill takes less than 2 seconds.  Psychiatric: She has a normal mood and affect. Her behavior is normal. Judgment and thought content normal.    Data: I have independently interpreted her ABIs which are triphasic bilaterally and greater than 1 bilaterally as well.     Assessment/Plan:     57 year old female presents for evaluation of bilateral lower extremity pain that is worse with ambulation and standing.  She has triphasic waveforms and palpable pulses that her feet so there is is not arterial insufficiency.  I discussed with her her risk factors for peripheral vascular disease and her modifiable risk factor at this time appears to be smoking cessation we discussed this for greater than 3 minutes.  I recommended she walk as much as tolerated and take aspirin therapy and stop smoking.  She does not need vascular intervention at this time he can follow-up on a as needed basis.     Waynetta Sandy MD [facility]

## 2018-04-19 ENCOUNTER — Encounter: Payer: Self-pay | Admitting: Podiatry

## 2018-04-19 ENCOUNTER — Ambulatory Visit: Payer: Medicare HMO | Admitting: Podiatry

## 2018-04-19 DIAGNOSIS — M7662 Achilles tendinitis, left leg: Secondary | ICD-10-CM | POA: Diagnosis not present

## 2018-04-19 DIAGNOSIS — M779 Enthesopathy, unspecified: Secondary | ICD-10-CM | POA: Diagnosis not present

## 2018-04-19 MED ORDER — TRIAMCINOLONE ACETONIDE 10 MG/ML IJ SUSP
10.0000 mg | Freq: Once | INTRAMUSCULAR | Status: AC
  Administered 2018-04-19: 10 mg

## 2018-04-21 NOTE — Progress Notes (Signed)
Subjective:   Patient ID: Jenna Wood, female   DOB: 57 y.o.   MRN: [id number]   HPI Patient states right ankle is doing pretty well but there is one spot that still tender   ROS      Objective:  Physical Exam  Neurovascular status intact with discomfort in the dorsal medial aspect of the right ankle in a slightly different place than previous     Assessment:  Dorsal medial tendinitis right     Plan:  I carefully injected the sheath of this particular spot 3 mg Kenalog 5 Milgram Xylocaine and advised on immobilization and placed in the air fracture walker which patient aware for the next several weeks.  Reappoint if symptoms persist signed visit signed visit

## 2018-07-27 ENCOUNTER — Encounter: Payer: Self-pay | Admitting: Gastroenterology

## 2018-08-10 ENCOUNTER — Institutional Professional Consult (permissible substitution): Payer: Medicare HMO | Admitting: Internal Medicine

## 2018-08-13 ENCOUNTER — Ambulatory Visit (INDEPENDENT_AMBULATORY_CARE_PROVIDER_SITE_OTHER)
Admission: RE | Admit: 2018-08-13 | Discharge: 2018-08-13 | Disposition: A | Discharge status: Home / Self Care | Payer: Medicare HMO | Source: Ambulatory Visit | Attending: Internal Medicine | Admitting: Internal Medicine

## 2018-08-13 ENCOUNTER — Telehealth: Payer: Self-pay | Admitting: Internal Medicine

## 2018-08-13 ENCOUNTER — Ambulatory Visit (INDEPENDENT_AMBULATORY_CARE_PROVIDER_SITE_OTHER): Payer: Medicare HMO | Admitting: Internal Medicine

## 2018-08-13 ENCOUNTER — Encounter: Payer: Self-pay | Admitting: Internal Medicine

## 2018-08-13 VITALS — BP 144/92 | HR 100 | Ht 63.0 in | Wt 277.0 lb

## 2018-08-13 DIAGNOSIS — R0609 Other forms of dyspnea: Secondary | ICD-10-CM

## 2018-08-13 DIAGNOSIS — I10 Essential (primary) hypertension: Secondary | ICD-10-CM | POA: Diagnosis not present

## 2018-08-13 MED ORDER — OMEPRAZOLE 20 MG PO CPDR: DELAYED_RELEASE_CAPSULE | ORAL | 11 refills | Status: AC

## 2018-08-13 MED ORDER — AZITHROMYCIN 250 MG PO TABS: ORAL_TABLET | ORAL | 0 refills | Status: DC

## 2018-08-13 MED ORDER — TELMISARTAN 40 MG PO TABS: 40.0000 mg | ORAL_TABLET | Freq: Every day | ORAL | 11 refills | Status: AC

## 2018-08-13 MED ORDER — AZITHROMYCIN 250 MG PO TABS
ORAL_TABLET | ORAL | 0 refills | Status: DC
Start: 2018-08-13 — End: 2018-08-14

## 2018-08-13 NOTE — Progress Notes (Signed)
Jenna Wood, female    DOB: 01/14/1961,    MRN: [id number]    Brief patient profile:  47 yowf active smoker last child born 1982 with baseline wt of 170 with new onset breathing problems 2015 > Pulmonary eval in [place] dx copd rx BREO no better but continued use saba hfa 2-3 x per day neb 1-2 x per month and worse sob x spring 2018 so referred to pulmonary clinic 08/13/2018 by Dr   Alphonzo Grieve  PA Dessie Coma with nl spirometry 08/13/2018 on ACEi     History of Present Illness  08/13/2018  Pulmonary/ 1st office eval/ Maram Bently   Chief Complaint  Patient presents with  . Pulmonary Consult    Referred by Dr. Lillia Corporal. Pt c/o SOB x 18 months. She gets winded walking approx 25 ft. She states she feels like she may have a sinus infection currently- prod cough with yellow sputum. She uses her albuterol inhaler 2-3 x per day and as duoneb that she rarely uses.   Dyspnea:  Progressively worse to point where room / rides cart at grocery store / uses [facility] parking  Cough: x one week congested/ rattly with min ywllow mucus esp in am's assoc with nasal congestion/ slt purulent d/c Sleep: bed flat/ 3 pillows  SABA use:  2-3 x per day   No obvious patterns in day to day or daytime variability or assoc  mucus plugs or hemoptysis or cp or chest tightness, subjective wheeze or overt   hb symptoms.   Also denies any obvious fluctuation of symptoms with weather or environmental changes or other aggravating or alleviating factors except as outlined above   No unusual exposure hx or h/o childhood pna/ asthma or knowledge of premature birth.  Current Allergies, Complete Past Medical History, Past Surgical History, Family History, and Social History were reviewed in [employer] record.  ROS  The following are not active complaints unless bolded Hoarseness, sore throat, dysphagia, dental problems, itching, sneezing,  nasal congestion or discharge of excess mucus or purulent secretions, ear  ache,   fever, chills, sweats, unintended wt loss or wt gain, classically pleuritic or exertional cp,  orthopnea pnd or arm/hand swelling  or leg swelling, presyncope, palpitations, abdominal pain, anorexia, nausea, vomiting, diarrhea  or change in bowel habits or change in bladder habits, change in stools or change in urine, dysuria, hematuria,  rash, arthralgias, visual complaints, headache, numbness, weakness or ataxia or problems with walking or coordination,  change in mood or  memory.                Past Medical History:  Diagnosis Date  . Chronic back pain   . Depression   . Diabetes mellitus without complication ([place])   . DJD (degenerative joint disease)   . Hypertension   . Migraine     Outpatient Medications Prior to Visit  Medication Sig Dispense Refill  . acetaminophen (TYLENOL) 325 MG tablet Take 2 tablets (650 mg total) by mouth every 6 (six) hours as needed for mild pain or moderate pain. 30 tablet 0  . albuterol (PROVENTIL HFA;VENTOLIN HFA) 108 (90 BASE) MCG/ACT inhaler Inhale 2 puffs into the lungs every 4 (four) hours as needed for wheezing or shortness of breath.     Marland Kitchen atorvastatin (LIPITOR) 10 MG tablet Take 10 mg by mouth every evening.     . Brexpiprazole (REXULTI) 2 MG TABS Take 1 tablet by mouth daily.    Marland Kitchen buPROPion (WELLBUTRIN SR) 100  MG 12 hr tablet TAKE 1 TABLET BY MOUTH EVERY MORNING FOR 1 WEEK THEN INCREASE TO 1 TABLET 2 TIMES DAILY  3  . busPIRone (BUSPAR) 15 MG tablet Take 15 mg by mouth 3 (three) times daily.    . cyclobenzaprine (FLEXERIL) 10 MG tablet Take 10 mg by mouth at bedtime.  2  . DULoxetine (CYMBALTA) 60 MG capsule Take 60 mg by mouth 2 (two) times daily.    . furosemide (LASIX) 40 MG tablet Take 40 mg by mouth daily.    . Guaifenesin (MUCINEX MAXIMUM STRENGTH) 1200 MG TB12 Take 1 tablet by mouth 2 (two) times daily as needed.    Marland Kitchen ipratropium-albuterol (DUONEB) 0.5-2.5 (3) MG/3ML SOLN 1 vial in nebulizer every 4 hours as needed    .  lisinopril (PRINIVIL,ZESTRIL) 10 MG tablet Take 10 mg by mouth daily.  3  . LYRICA 150 MG capsule Take 150 mg by mouth 2 (two) times daily.  2  . omeprazole (PRILOSEC) 20 MG capsule Take by mouth daily.  6  . pentoxifylline (TRENTAL) 400 MG CR tablet Take 400 mg by mouth 3 (three) times daily.  3  . potassium chloride SA (K-DUR,KLOR-CON) 20 MEQ tablet Take 20 mEq by mouth daily.    . TRAVATAN Z 0.004 % SOLN ophthalmic solution Place 1 drop into both eyes at bedtime.  4  . metFORMIN (GLUCOPHAGE) 500 MG tablet Take 500 mg by mouth 2 (two) times daily.  2  . topiramate (TOPAMAX) 50 MG tablet Take 50 mg by mouth 2 (two) times daily.  2  . UNABLE TO FIND 2 mg. Med Name: [place]                Objective:     BP (!) 144/92 (BP Location: Left Arm, Cuff Size: Normal)   Pulse 100   Ht 5\' 3"  (1.6 m)   Wt 277 lb (125.6 kg)   SpO2 100%   BMI 49.07 kg/m   SpO2: 100 %  RA with no meds today    Top plate / partial lower mp secretions in R    Obese amb wf nad   HEENT: Top plate/ partial lower with MP secretions R  turbinate, and oropharynx. Nl external ear canals without cough reflex   NECK :  without JVD/Nodes/TM/ nl carotid upstrokes bilaterally   LUNGS: no acc muscle use,  Nl contour chest  Mostly upper airway insp/exp rhonchi without cough on insp or exp maneuvers   CV:  RRR  no s3 or murmur or increase in P2, and no edema   ABD:  soft and nontender with nl inspiratory excursion in the supine position. No bruits or organomegaly appreciated, bowel sounds nl  MS:  Nl gait/ ext warm without deformities, calf tenderness, cyanosis or clubbing No obvious joint restrictions   SKIN: warm and dry without lesions    NEURO:  alert, approp, nl sensorium with  no motor or cerebellar deficits apparent.     CXR PA and Lateral:   08/13/2018 :    I personally reviewed images and agree with radiology impression as follows:    Cardiomediastinal silhouette is normal. Mildly calcified  aortic arch. No pleural effusions or focal consolidations. Mild bronchitic changes. RIGHT diaphragmatic hepatic eventration.      Assessment   DOE (dyspnea on exertion) Spirometry 08/13/2018  FEV1 2.0 (79%)  Ratio 84 s prior  rx and flat effort dep portion of f/v loop  - 08/13/2018  Walked RA x 3 laps @  185 ft each stopped due to end of study, fast pace, no desat, min sob     Symptoms are markedly disproportionate to objective findings and not clear to what extent this is actually a pulmonary  problem but pt does appear to have difficult to sort out respiratory symptoms of unknown origin for which  DDX  = almost all start with A and  include Adherence, Ace Inhibitors, Acid Reflux, Active Sinus Disease, Alpha 1 Antitripsin deficiency, Anxiety masquerading as Airways dz,  ABPA,  Allergy(esp in young), Aspiration (esp in elderly), Adverse effects of meds,  Active smokers, A bunch of PE's/clot burden (a few small clots can't cause this syndrome unless there is already severe underlying pulm or vascular dz with poor reserve),  Anemia or thyroid disorder, plus two Bs  = Bronchiectasis and Beta blocker use..and one C= CHF     Adherence is always the initial "prime suspect" and is a multilayered concern that requires a "trust but verify" approach in every patient - starting with knowing how to use medications, especially inhalers, correctly, keeping up with refills and understanding the fundamental difference between maintenance and prns vs those medications only taken for a very short course and then stopped and not refilled.  - return with all meds in hand using a trust but verify approach to confirm accurate Medication  Reconciliation The principal here is that until we are certain that the  patients are doing what we've asked, it makes no sense to ask them to do more.    ACEi adverse effects at the  top of the usual list of suspects and the only way to rule it out is a trial off > see a/p    Active  smoking also a major concern > see a/p    ? Acid (or non-acid) GERD > always difficult to exclude as up to 75% of pts in some series report no assoc GI/ Heartburn symptoms> rec max (24h)  acid suppression and diet restrictions/ reviewed and instructions given in writing.   ? Anxiety/depression > deconditioning / wt gain  > usually at the bottom of this list of usual suspects but should be much higher on this pt's based on H and P and note already on psychotropics and may interfere with adherence and also interpretation of response or lack thereof to symptom management which can be quite subjective.   ? 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    In the best review of chronic cough to date ( NEJM 2016 375 3151-7616) ,  ACEi are now felt to cause cough in up to  20% of pts which is a 4 fold increase from previous reports and does not include the variety of non-specific complaints we see in pulmonary clinic in pts on ACEi but previously attributed to another dx like  Copd/asthma(she could still have asthma but copd criteria not present) and  include PNDS, throat and chest congestion, "bronchitis", unexplained dyspnea and noct "strangling" sensations, and hoarseness, but also  atypical /refractory GERD symptoms like dysphagia and "bad heartburn"   The only way I know  to prove this is not an "ACEi Case" is a trial off ACEi x a minimum of 6 weeks then regroup.    Try change to micardis 40 mg daily  and d/c lisinopril 10 and return in 6  weeks to regroup  Morbid obesity due to excess calories (HCC) Body mass index is 49.07 kg/m.    No results found for: TSH   Contributing to gerd risk/ doe/reviewed the need and the process to achieve and maintain neg calorie balance > defer f/u primary care including intermittently monitoring thyroid status       Total time devoted to counseling  > 50 % of initial 60 min office visit:  review case with pt/ discussion of options/alternatives/ personally creating written customized instructions  in presence of pt  then going over those specific  Instructions directly with the pt including how to use all of the meds but in particular covering each new medication in detail and the difference between the maintenance= "automatic" meds and the prns using an action plan format for the latter (If this problem/symptom => do that organization reading Left to right).  Please see AVS from this visit for a full list of these instructions which I personally wrote for this pt and  are unique to this visit.      Christinia Gully, MD 08/13/2018

## 2018-08-13 NOTE — Telephone Encounter (Signed)
Called and spoke with pt telling her to still keep her consult with MW today at 1:30 even though she had a sinus infection. Stated to pt maybe MW could be able to give her recommendations or help her out with the sinus infection that she currently has.  Pt expressed understanding. Nothing further needed.

## 2018-08-13 NOTE — Patient Instructions (Addendum)
Plan A = Automatic = stop lisinopril and start micardis 40 mg one each am and change omeparzole to 20 mg Take 30-60 min before first meal of the day   zpak    Plan B = Backup Only use your albuterol (Ventolin) as a rescue medication to be used if you can't catch your breath by resting or doing a relaxed purse lip breathing pattern.  - The less you use it, the better it will work when you need it. - Ok to use the inhaler up to 2 puffs  every 4 hours if you must but call for appointment if use goes up over your usual need - Don't leave home without it !!  (think of it like the spare tire for your car)    Plan C = Crisis - only use your albuterol nebulizer if you first try Plan B and it fails to help > ok to use the nebulizer up to every 4 hours but if start needing it regularly call for immediate appointment   GERD (REFLUX)  is an extremely common cause of respiratory symptoms just like yours , many times with no obvious heartburn at all.    It can be treated with medication, but also with lifestyle changes including elevation of the head of your bed (ideally with 6 inch  bed blocks),  Smoking cessation, avoidance of late meals, excessive alcohol, and avoid fatty foods, chocolate, peppermint, colas, red wine, and acidic juices such as orange juice.  NO MINT OR MENTHOL PRODUCTS SO NO COUGH DROPS   USE SUGARLESS CANDY INSTEAD (Jolley ranchers or Stover's or Life Savers) or even ice chips will also do - the key is to swallow to prevent all throat clearing. NO OIL BASED VITAMINS - use powdered substitutes.    The key is to stop smoking completely before smoking completely stops you!    Please remember to go to the  x-ray department downstairs in the basement  for your tests - we will call you with the results when they are available.        Please schedule a follow up office visit in 6 weeks, call sooner if needed  - late add: error on omeprazole instructon  should say Take 30- 60 min  before your first and last meals of the day

## 2018-08-14 ENCOUNTER — Encounter: Payer: Self-pay | Admitting: Internal Medicine

## 2018-08-14 DIAGNOSIS — I10 Essential (primary) hypertension: Secondary | ICD-10-CM | POA: Insufficient documentation

## 2018-08-14 NOTE — Assessment & Plan Note (Signed)
Body mass index is 49.07 kg/m.   No results found for: TSH   Contributing to gerd risk/ doe/reviewed the need and the process to achieve and maintain neg calorie balance > defer f/u primary care including intermittently monitoring thyroid status      Total time devoted to counseling  > 50 % of initial 60 min office visit:  review case with pt/ discussion of options/alternatives/ personally creating written customized instructions  in presence of pt  then going over those specific  Instructions directly with the pt including how to use all of the meds but in particular covering each new medication in detail and the difference between the maintenance= "automatic" meds and the prns using an action plan format for the latter (If this problem/symptom => do that organization reading Left to right).  Please see AVS from this visit for a full list of these instructions which I personally wrote for this pt and  are unique to this visit.

## 2018-08-14 NOTE — Assessment & Plan Note (Addendum)
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/2018 due to pseudoasthma/copd    In the best review of chronic cough to date ( NEJM 2016 375 4854-6270) ,  ACEi are now felt to cause cough in up to  20% of pts which is a 4 fold increase from previous reports and does not include the variety of non-specific complaints we see in pulmonary clinic in pts on ACEi but previously attributed to another dx like  Copd/asthma(she could still have asthma but copd criteria not present) and  include PNDS, throat and chest congestion, "bronchitis", unexplained dyspnea and noct "strangling" sensations, and hoarseness, but also  atypical /refractory GERD symptoms like dysphagia and "bad heartburn"   The only way I know  to prove this is not an "ACEi Case" is a trial off ACEi x a minimum of 6 weeks then regroup.    Try change to micardis 40 mg daily  and d/c lisinopril 10 and return in 6 weeks to regroup

## 2018-08-14 NOTE — Assessment & Plan Note (Addendum)
Spirometry 08/13/2018  FEV1 2.0 (79%)  Ratio 84 s prior  rx and flat effort dep portion of f/v loop  - 08/13/2018  Walked RA x 3 laps @ 185 ft each stopped due to end of study, fast pace, no desat, min sob     Symptoms are markedly disproportionate to objective findings and not clear to what extent this is actually a pulmonary  problem but pt does appear to have difficult to sort out respiratory symptoms of unknown origin for which  DDX  = almost all start with A and  include Adherence, Ace Inhibitors, Acid Reflux, Active Sinus Disease, Alpha 1 Antitripsin deficiency, Anxiety masquerading as Airways dz,  ABPA,  Allergy(esp in young), Aspiration (esp in elderly), Adverse effects of meds,  Active smokers, A bunch of PE's/clot burden (a few small clots can't cause this syndrome unless there is already severe underlying pulm or vascular dz with poor reserve),  Anemia or thyroid disorder, plus two Bs  = Bronchiectasis and Beta blocker use..and one C= CHF     Adherence is always the initial "prime suspect" and is a multilayered concern that requires a "trust but verify" approach in every patient - starting with knowing how to use medications, especially inhalers, correctly, keeping up with refills and understanding the fundamental difference between maintenance and prns vs those medications only taken for a very short course and then stopped and not refilled.  - return with all meds in hand using a trust but verify approach to confirm accurate Medication  Reconciliation The principal here is that until we are certain that the  patients are doing what we've asked, it makes no sense to ask them to do more.    ACEi adverse effects at the  top of the usual list of suspects and the only way to rule it out is a trial off > see a/p    Active smoking also a major concern > see a/p    ? Acid (or non-acid) GERD > always difficult to exclude as up to 75% of pts in some series report no assoc GI/ Heartburn symptoms>  rec max (24h)  acid suppression and diet restrictions/ reviewed and instructions given in writing.   ? Anxiety/depression > deconditioning / wt gain  > usually at the bottom of this list of usual suspects but should be much higher on this pt's based on H and P and note already on psychotropics and may interfere with adherence and also interpretation of response or lack thereof to symptom management which can be quite subjective.   ? Active sinus dz > doubt, so try just zpak for now and augmentin x 10 days if secretions don't clear up

## 2018-08-16 NOTE — Progress Notes (Signed)
Spoke with pt and notified of results per Dr. Wert. Pt verbalized understanding and denied any questions. 

## 2018-09-03 ENCOUNTER — Encounter: Payer: Self-pay | Admitting: Gastroenterology

## 2018-09-03 ENCOUNTER — Ambulatory Visit (INDEPENDENT_AMBULATORY_CARE_PROVIDER_SITE_OTHER): Payer: Medicare HMO | Admitting: Gastroenterology

## 2018-09-03 VITALS — BP 110/64 | HR 84 | Ht 63.0 in | Wt 278.6 lb

## 2018-09-03 DIAGNOSIS — R1011 Right upper quadrant pain: Secondary | ICD-10-CM | POA: Diagnosis not present

## 2018-09-03 DIAGNOSIS — R1310 Dysphagia, unspecified: Secondary | ICD-10-CM

## 2018-09-03 DIAGNOSIS — R197 Diarrhea, unspecified: Secondary | ICD-10-CM | POA: Diagnosis not present

## 2018-09-03 NOTE — Patient Instructions (Addendum)
Use a daily stool bulking agent such at Metamucil or Benefiber every day between now and your endoscopy appointment.  Continue to take omeprazole 20 mg twice daily.   Avoid all non-steroidal antiinflammatory medications such as Naproxen as you are able.  Quit smoking. This will help with your GI health and decrease your risk for many cancers.   Work to maintain a healthy weight.   Tips for colonoscopy:  -STAY WELL HYDRATED FOR 3-4 DAYS PRIOR TO THE EXAM. This reduces nausea and dehydration.  -TO PREVENT SKIN/HEMORRHOID IRRITATION- prior to wiping, put A&Dointment or vaseline on the toilet paper. -Keep a towel or pad on the bed.  -DRINK 64oz of clear liquids in the morning of prep day (PRIOR TO STARTING THE PREP) to be sure that there is enough fluid to flush the colon and stay hydrated!!!! This is in addition to the fluids required for preparation.

## 2018-09-03 NOTE — Progress Notes (Signed)
Referring Provider: [facility] Primary Care Physician:  Javier Docker, MD   Reason for Consultation: Diarrhea, gastric ulcers   IMPRESSION:  Diarrhea x 1 month LUQ pain x 2 months Dysphagia to solids and liquids x 3-4 months RUQ abdominal pain History of gastric ulcers 2012 BMI 49 Frequent Naproxen Distant cholecystectomy for gallstones  Chronic symptoms without alarm features. Differential of pain and diarrhea is broad and includes irritable bowel syndrome, IBD, missed infection (such as giardia), dysmotility,  food intolerance, microscopic colitis, thyroid disorder, other functional GI disease. By history, this is less likely to be obstruction.   Dysphagia differential includes reflux, non-erosive esophagitis, stricture, ring, web, or Barrett's esophagus. If dysphagia truly includes both solids and liquids (patient was unable to further clarify), motility disorders are in the differential.   PLAN: Daily stool bulking agent EGD with duodenal biopsies and colonoscopy with random colon biopsies Avoid all NSAIDs Continue omeprazole 20 mg BID Obtain records from [place], [place] Trial of Colestid if endoscopic evaluation is nondiagnostic  I consented the patient at the bedside today discussing the risks, benefits, and alternatives to endoscopic evaluation. In particular, we discussed the risks that include, but are not limited to, reaction to medication, cardiopulmonary compromise, bleeding requiring blood transfusion, aspiration resulting in pneumonia, perforation requiring surgery, and even death. We reviewed the risk of missed lesion including polyps or even cancer. The patient acknowledges these risks and asks that we proceed.   HPI: Jenna Wood is a 57 y.o. female seen in consultation at the request of nurse practitioner Odem for further evaluation of diarrhea and gastric ulcers.  History is obtained through the patient and review of her referral  records. He daughter-in-law accompanies her to this appointment.  She has morbid obesity stage III chronic kidney disease, type 2 diabetes mellitus with diabetic retinopathy, peripheral vascular disease on [place], COPD, prior cholecystectomy, PTSD, depression, and fibromyalgia.  She continues to smoke. She has three primary GI complaints today:  (1) Post-prandial and non-post-prandial intermittent diarrhea x 1 months. 3-4 BM daily. Explosive, watery. Associated urgency. Every other day. No blood or mucous. She wonders if it is a side effect of a medication. No systemic complaints. No extraGI manifestations of IBD.   (2) 2-3 months of postprandial dull, LUQ ache that starts 30 minutes after eating, improves after 2-3 hours. Does not appear to be related to diarrhea. No other associated symptoms. No identified exacerbating or relieving features.   (3) Dysphagia to solids and liquids x 3-4 months. Not related to the pain. Intentional swallowing. [place] brash when supine. Increased cough recently that has not changed with a switch of ace inhibitor. He started her on omeprazole 20 mg BID. Previously taking one daily QHS.    She takes Naprosyn 375 mg twice daily once weekly. She notes some stomach upset with it's use so she tries to avoid it.   History of PUD on endoscopy with Dr. Annamaria Boots in [place], [place] 7 years ago. She does not remember the symptoms that she was having at that time. A concurrent colonoscopy was performed but she does not remember the results. History of gallstone pancreatitis 8-9 years ago, also in [place]. She had a cholecystectomy. Some post-cholecystectomy symptoms but only with greasy or tomato-based foods. No diarrhea for many years until the last month.   Recently seen by Dr. Melvyn Novas for dyspnea on exertion. She had spirometry and his notes mention a diagnosis of pseudo-asthma or COPD.  He suggested that GERD  may be contributing.    Labs from 07/14/2018 show a normal  comprehensive metabolic panel except for a glucose of 141.  Her hemoglobin A1c was 6.2.  CBC showed a white count of 11.5, hemoglobin 15.7, MCV 92.4, RDW 13.5, platelets 345.   Past Medical History:  Diagnosis Date  . Anxiety   . Chronic back pain   . COPD (chronic obstructive pulmonary disease) ([place])   . Depression   . Diabetes mellitus without complication ([place])   . DJD (degenerative joint disease)   . Fibromyalgia   . Gallstones   . GERD (gastroesophageal reflux disease)   . Hyperlipidemia   . Hypertension   . Migraine   . Pancreatitis     Past Surgical History:  Procedure Laterality Date  . CHOLECYSTECTOMY    . WISDOM TOOTH EXTRACTION      Prior to Admission medications   Medication Sig Start Date End Date Taking? Authorizing Provider  acetaminophen (TYLENOL) 325 MG tablet Take 2 tablets (650 mg total) by mouth every 6 (six) hours as needed for mild pain or moderate pain. 02/25/18   Avie Echevaria B, PA-C  albuterol (PROVENTIL HFA;VENTOLIN HFA) 108 (90 BASE) MCG/ACT inhaler Inhale 2 puffs into the lungs every 4 (four) hours as needed for wheezing or shortness of breath.     [provider]  atorvastatin (LIPITOR) 10 MG tablet Take 10 mg by mouth every evening.     [provider]  azithromycin (ZITHROMAX) 250 MG tablet Take 2 on day one then 1 daily x 4 days 08/13/18   Tanda Rockers, MD  Brexpiprazole (REXULTI) 2 MG TABS Take 1 tablet by mouth daily.    [provider]  buPROPion (WELLBUTRIN SR) 100 MG 12 hr tablet TAKE 1 TABLET BY MOUTH EVERY MORNING FOR 1 WEEK THEN INCREASE TO 1 TABLET 2 TIMES DAILY 02/15/18   [provider]  busPIRone (BUSPAR) 15 MG tablet Take 15 mg by mouth 3 (three) times daily.    [provider]  cyclobenzaprine (FLEXERIL) 10 MG tablet Take 10 mg by mouth at bedtime. 03/11/16   [provider]  DULoxetine (CYMBALTA) 60 MG capsule Take 60 mg by mouth 2 (two) times daily.    [provider]    furosemide (LASIX) 40 MG tablet Take 40 mg by mouth daily.    [provider]  Guaifenesin (MUCINEX MAXIMUM STRENGTH) 1200 MG TB12 Take 1 tablet by mouth 2 (two) times daily as needed.    [provider]  ipratropium-albuterol (DUONEB) 0.5-2.5 (3) MG/3ML SOLN 1 vial in nebulizer every 4 hours as needed 07/27/18   [provider]  LYRICA 150 MG capsule Take 150 mg by mouth 2 (two) times daily. 03/12/16   [provider]  omeprazole (PRILOSEC) 20 MG capsule Take 30- 60 min before your first and last meals of the day 08/13/18   Tanda Rockers, MD  pentoxifylline (TRENTAL) 400 MG CR tablet Take 400 mg by mouth 3 (three) times daily. 03/13/16   [provider]  potassium chloride SA (K-DUR,KLOR-CON) 20 MEQ tablet Take 20 mEq by mouth daily.    [provider]  telmisartan (MICARDIS) 40 MG tablet Take 1 tablet (40 mg total) by mouth daily. 08/13/18   Tanda Rockers, MD  TRAVATAN Z 0.004 % SOLN ophthalmic solution Place 1 drop into both eyes at bedtime. 06/02/16   [provider]    Current Outpatient Medications  Medication Sig Dispense Refill  . acetaminophen (TYLENOL) 325  MG tablet Take 2 tablets (650 mg total) by mouth every 6 (six) hours as needed for mild pain or moderate pain. 30 tablet 0  . albuterol (PROVENTIL HFA;VENTOLIN HFA) 108 (90 BASE) MCG/ACT inhaler Inhale 2 puffs into the lungs every 4 (four) hours as needed for wheezing or shortness of breath.     Marland Kitchen buPROPion (WELLBUTRIN SR) 100 MG 12 hr tablet TAKE 1 TABLET BY MOUTH EVERY MORNING FOR 1 WEEK THEN INCREASE TO 1 TABLET 2 TIMES DAILY  3  . busPIRone (BUSPAR) 15 MG tablet Take 15 mg by mouth 3 (three) times daily.    . cyclobenzaprine (FLEXERIL) 10 MG tablet Take 10 mg by mouth at bedtime.  2  . DULoxetine (CYMBALTA) 60 MG capsule Take 60 mg by mouth 2 (two) times daily.    . furosemide (LASIX) 40 MG tablet Take 40 mg by mouth daily.    Marland Kitchen ipratropium-albuterol (DUONEB) 0.5-2.5 (3)  MG/3ML SOLN 1 vial in nebulizer every 4 hours as needed    . LYRICA 150 MG capsule Take 150 mg by mouth 2 (two) times daily.  2  . omeprazole (PRILOSEC) 20 MG capsule Take 30- 60 min before your first and last meals of the day 60 capsule 11  . pentoxifylline (TRENTAL) 400 MG CR tablet Take 400 mg by mouth 3 (three) times daily.  3  . potassium chloride SA (K-DUR,KLOR-CON) 20 MEQ tablet Take 20 mEq by mouth daily.    Marland Kitchen telmisartan (MICARDIS) 40 MG tablet Take 1 tablet (40 mg total) by mouth daily. 30 tablet 11  . TRAVATAN Z 0.004 % SOLN ophthalmic solution Place 1 drop into both eyes at bedtime.  4   No current facility-administered medications for this visit.     Allergies as of 09/03/2018  . (No Known Allergies)    Family History  Problem Relation Age of Onset  . Lung cancer Father   . Lung cancer Brother   . Diabetes Mother   . Hypertension Mother   . Anemia Mother   . Breast cancer Maternal Aunt   . Colon cancer Neg Hx   . Esophageal cancer Neg Hx   . Pancreatic cancer Neg Hx   . Stomach cancer Neg Hx   . Liver disease Neg Hx     Social History   Socioeconomic History  . Marital status: Divorced    Spouse name: Not on file  . Number of children: Not on file  . Years of education: Not on file  . Highest education level: Not on file  Occupational History  . Not on file  Social Needs  . Financial resource strain: Not on file  . Food insecurity:    Worry: Not on file    Inability: Not on file  . Transportation needs:    Medical: Not on file    Non-medical: Not on file  Tobacco Use  . Smoking status: Current Every Day Smoker    Packs/day: 1.00    Years: 40.00    Pack years: 40.00    Types: Cigarettes  . Smokeless tobacco: Never Used  Substance and Sexual Activity  . Alcohol use: No  . Drug use: No  . Sexual activity: Not on file  Lifestyle  . Physical activity:    Days per week: Not on file    Minutes per session: Not on file  . Stress: Not on file    Relationships  . Social connections:    Talks on phone: Not on file  Gets together: Not on file    Attends religious service: Not on file    Active member of club or organization: Not on file    Attends meetings of clubs or organizations: Not on file    Relationship status: Not on file  . Intimate partner violence:    Fear of current or ex partner: Not on file    Emotionally abused: Not on file    Physically abused: Not on file    Forced sexual activity: Not on file  Other Topics Concern  . Not on file  Social History Narrative  . Not on file    Review of Systems: 12 system ROS is negative except as noted above.   Physical Exam: Vital signs were reviewed. BMI 49. General:   Alert, well-nourished, pleasant and cooperative in NAD Head:  Normocephalic and atraumatic. Eyes:  Sclera clear, no icterus.   Conjunctiva pink. Mouth:  No deformity or lesions.   Neck:  Supple; no thyromegaly. Lungs:  Clear throughout to auscultation.   No wheezes.  Heart:  Regular rate and rhythm; no murmurs Abdomen:  Soft, central obesity, mild focal tenderness in the LUQ in the midline below the costal margin, normal bowel sounds. No rebound or guarding. No hepatosplenomegaly Rectal:  Deferred  Msk:  Symmetrical without gross deformities. Extremities:  No gross deformities or edema. Neurologic:  Alert and  oriented x4;  grossly nonfocal Skin:  No rash or bruise. Psych:  Alert and cooperative. Normal mood and affect.   Staley Lunz L. Tarri Glenn Md, MPH [facility] 09/03/2018, 10:09 AM

## 2018-09-09 ENCOUNTER — Encounter: Payer: Self-pay | Admitting: Gastroenterology

## 2018-09-23 ENCOUNTER — Ambulatory Visit (AMBULATORY_SURGERY_CENTER): Payer: Medicare HMO | Admitting: Gastroenterology

## 2018-09-23 ENCOUNTER — Other Ambulatory Visit: Payer: Self-pay

## 2018-09-23 ENCOUNTER — Encounter: Payer: Self-pay | Admitting: Gastroenterology

## 2018-09-23 VITALS — BP 110/59 | HR 87 | Temp 98.6°F | Resp 16 | Ht 63.0 in | Wt 278.0 lb

## 2018-09-23 DIAGNOSIS — R1310 Dysphagia, unspecified: Secondary | ICD-10-CM | POA: Diagnosis not present

## 2018-09-23 DIAGNOSIS — D125 Benign neoplasm of sigmoid colon: Secondary | ICD-10-CM

## 2018-09-23 DIAGNOSIS — K621 Rectal polyp: Secondary | ICD-10-CM

## 2018-09-23 DIAGNOSIS — K635 Polyp of colon: Secondary | ICD-10-CM

## 2018-09-23 DIAGNOSIS — R197 Diarrhea, unspecified: Secondary | ICD-10-CM | POA: Diagnosis not present

## 2018-09-23 DIAGNOSIS — K3189 Other diseases of stomach and duodenum: Secondary | ICD-10-CM | POA: Diagnosis not present

## 2018-09-23 DIAGNOSIS — D128 Benign neoplasm of rectum: Secondary | ICD-10-CM

## 2018-09-23 MED ORDER — COLESEVELAM HCL 625 MG PO TABS: 625.0000 mg | ORAL_TABLET | Freq: Two times a day (BID) | ORAL | 1 refills | Status: DC

## 2018-09-23 MED ORDER — SODIUM CHLORIDE 0.9 % IV SOLN: 500.0000 mL | Freq: Once | INTRAVENOUS | Status: DC

## 2018-09-23 NOTE — Progress Notes (Signed)
Called to room to assist during endoscopic procedure.  Patient ID and intended procedure confirmed with present staff. Received instructions for my participation in the procedure from the performing physician.  

## 2018-09-23 NOTE — Progress Notes (Signed)
PT taken to PACU. Monitors in place. VSS. Report given to RN. 

## 2018-09-23 NOTE — Op Note (Addendum)
[place] Patient Name: Jenna Wood Procedure Date: 09/23/2018 2:31 PM MRN: [id number] Endoscopist: Thornton Park MD, MD Age: 57 Referring MD:  Date of Birth: 02-27-1961 Gender: Female Account #: [id number] Procedure:                Colonoscopy Indications:              Chronic diarrhea. No known family history of colon                            cancer or polyps. Medicines:                See the Anesthesia note for documentation of the                            administered medications Procedure:                Pre-Anesthesia Assessment:                           - Prior to the procedure, a History and Physical                            was performed, and patient medications and                            allergies were reviewed. The patient's tolerance of                            previous anesthesia was also reviewed. The risks                            and benefits of the procedure and the sedation                            options and risks were discussed with the patient.                            All questions were answered, and informed consent                            was obtained. Prior Anticoagulants: The patient has                            taken no previous anticoagulant or antiplatelet                            agents. ASA Grade Assessment: III - A patient with                            severe systemic disease. After reviewing the risks                            and benefits, the patient was deemed in  satisfactory condition to undergo the procedure.                           After obtaining informed consent, the colonoscope                            was passed under direct vision. Throughout the                            procedure, the patient's blood pressure, pulse, and                            oxygen saturations were monitored continuously. The                            Colonoscope was introduced through  the anus and                            advanced to the the terminal ileum, with                            identification of the appendiceal orifice and IC                            valve. The colonoscopy was performed without                            difficulty. The patient tolerated the procedure                            well. The quality of the bowel preparation was good. Scope In: 2:49:32 PM Scope Out: 3:09:35 PM Scope Withdrawal Time: 0 hours 16 minutes 6 seconds  Total Procedure Duration: 0 hours 20 minutes 3 seconds  Findings:                 The perianal and digital rectal examinations were                            normal.                           Four sessile polyps were found in the rectum,                            recto-sigmoid colon and sigmoid colon. The polyps                            were 1 to 3 mm in size. These polyps were removed                            with a cold biopsy forceps. Resection and retrieval                            were complete.  The colon (entire examined portion) appeared                            normal. Biopsies were taken with a cold forceps for                            histology.                           The exam was otherwise without abnormality on                            direct and retroflexion views. Complications:            No immediate complications. Estimated Blood Loss:     Estimated blood loss: none. Impression:               - Four 1 to 3 mm polyps in the rectum, at the                            recto-sigmoid colon and in the sigmoid colon,                            removed with a cold biopsy forceps. Resected and                            retrieved.                           - The entire examined colon is normal. Biopsied.                           - The examination was otherwise normal on direct                            and retroflexion views. Recommendation:           - Discharge  patient to home.                           - Resume regular diet.                           - Continue present medications.                           - Await pathology results.                           - If 1 or 2 polyps is adenomatous, will repeat                            colonoscopy in 5 years. If 3 or more polyps is                            adenomatous, will repeat colonoscopy in 5 years.  Plan repeat colonoscopy in 10 years for screening                            purposes if the polyps are not adenomatous.                           - Return to GI office in 2-3 weeks.                           - Welchol (colesevelam HCl) 3 tablets PO BID for 6                            weeks. Thornton Park MD, MD 09/23/2018 3:19:57 PM This report has been signed electronically.

## 2018-09-23 NOTE — Op Note (Signed)
[place] Patient Name: Jenna Wood Procedure Date: 09/23/2018 2:32 PM MRN: [id number] Endoscopist: Thornton Park MD, MD Age: 57 Referring MD:  Date of Birth: [birth year] Gender: Female Account #: [id number] Procedure:                Upper GI endoscopy Indications:              Dysphagia, Diarrhea Medicines:                See the Anesthesia note for documentation of the                            administered medications Procedure:                Pre-Anesthesia Assessment:                           - Prior to the procedure, a History and Physical                            was performed, and patient medications and                            allergies were reviewed. The patient's tolerance of                            previous anesthesia was also reviewed. The risks                            and benefits of the procedure and the sedation                            options and risks were discussed with the patient.                            All questions were answered, and informed consent                            was obtained. Prior Anticoagulants: The patient has                            taken no previous anticoagulant or antiplatelet                            agents. ASA Grade Assessment: III - A patient with                            severe systemic disease. After reviewing the risks                            and benefits, the patient was deemed in                            satisfactory condition to undergo the procedure.  After obtaining informed consent, the endoscope was                            passed under direct vision. Throughout the                            procedure, the patient's blood pressure, pulse, and                            oxygen saturations were monitored continuously. The                            Endoscope was introduced through the mouth, and                            advanced to the third part of  duodenum. The upper                            GI endoscopy was accomplished without difficulty.                            The patient tolerated the procedure well. Scope In: Scope Out: Findings:                 The examined esophagus was normal. Biopsies were                            taken with a cold forceps for histology.                           The entire examined stomach was normal.                           The examined duodenum was normal.                           The exam was otherwise without abnormality. Complications:            No immediate complications. Estimated Blood Loss:     Estimated blood loss: none. Impression:               - Normal esophagus. Biopsied.                           - Normal stomach.                           - Normal examined duodenum.                           - The examination was otherwise normal.                           -No obvious source of symptoms identified on this                            examination. Recommendation:           -  Await pathology results.                           -Proceed with colonoscopy today as previously                            planned. Thornton Park MD, MD 09/23/2018 3:13:35 PM This report has been signed electronically.

## 2018-09-23 NOTE — Patient Instructions (Signed)
YOU HAD AN ENDOSCOPIC PROCEDURE TODAY AT [facility]:   Refer to the procedure report that was given to you for any specific questions about what was found during the examination.  If the procedure report does not answer your questions, please call your gastroenterologist to clarify.  If you requested that your care partner not be given the details of your procedure findings, then the procedure report has been included in a sealed envelope for you to review at your convenience later.  YOU SHOULD EXPECT: Some feelings of bloating in the abdomen. Passage of more gas than usual.  Walking can help get rid of the air that was put into your GI tract during the procedure and reduce the bloating. If you had a lower endoscopy (such as a colonoscopy or flexible sigmoidoscopy) you may notice spotting of blood in your stool or on the toilet paper. If you underwent a bowel prep for your procedure, you may not have a normal bowel movement for a few days.  Please Note:  You might notice some irritation and congestion in your nose or some drainage.  This is from the oxygen used during your procedure.  There is no need for concern and it should clear up in a day or so.  SYMPTOMS TO REPORT IMMEDIATELY:   Following lower endoscopy (colonoscopy or flexible sigmoidoscopy):  Excessive amounts of blood in the stool  Significant tenderness or worsening of abdominal pains  Swelling of the abdomen that is new, acute  Fever of 100F or higher   Following upper endoscopy (EGD)  Vomiting of blood or coffee ground material  New chest pain or pain under the shoulder blades  Painful or persistently difficult swallowing  New shortness of breath  Fever of 100F or higher  Black, tarry-looking stools  For urgent or emergent issues, a gastroenterologist can be reached at any hour by calling [phone].   DIET:  We do recommend a small meal at first, but then you may proceed to your regular diet.  Drink  plenty of fluids but you should avoid alcoholic beverages for 24 hours.  MEDICATIONS: Continue present medications. Welchol (colesevelam HCL) 3 tablets by mouth twice daily for 6 weeks.  Please see handouts given to you by your recovery nurse.  Follow up with Dr. Tarri Glenn in her office in 2 to 3 weeks.  ACTIVITY:  You should plan to take it easy for the rest of today and you should NOT DRIVE or use heavy machinery until tomorrow (because of the sedation medicines used during the test).    FOLLOW UP: Our staff will call the number listed on your records the next business day following your procedure to check on you and address any questions or concerns that you may have regarding the information given to you following your procedure. If we do not reach you, we will leave a message.  However, if you are feeling well and you are not experiencing any problems, there is no need to return our call.  We will assume that you have returned to your regular daily activities without incident.  If any biopsies were taken you will be contacted by phone or by letter within the next 1-3 weeks.  Please call us at [phone] if you have not heard about the biopsies in 3 weeks.   Thank you for allowing [place] to provide for your healthcare needs today.  SIGNATURES/CONFIDENTIALITY: You and/or your care partner have signed paperwork which will be entered into your  electronic medical record.  These signatures attest to the fact that that the information above on your After Visit Summary has been reviewed and is understood.  Full responsibility of the confidentiality of this discharge information lies with you and/or your care-partner.

## 2018-09-23 NOTE — Progress Notes (Signed)
Patient's pharmacy called recovery to verify discharge med order sent in for Welchol 3 tabs po bid x 6 weeks. I had verified this dose with Dr. Tarri Glenn prior to sending it to the pharmacy and prior to patient's discharge and reconfirmed with pharmacy upon their call.

## 2018-09-24 ENCOUNTER — Telehealth: Payer: Self-pay

## 2018-09-24 ENCOUNTER — Other Ambulatory Visit: Payer: Self-pay

## 2018-09-24 ENCOUNTER — Ambulatory Visit: Payer: Medicare HMO | Admitting: Internal Medicine

## 2018-09-24 ENCOUNTER — Encounter: Payer: Self-pay | Admitting: Internal Medicine

## 2018-09-24 DIAGNOSIS — J45991 Cough variant asthma: Secondary | ICD-10-CM | POA: Insufficient documentation

## 2018-09-24 DIAGNOSIS — R0609 Other forms of dyspnea: Secondary | ICD-10-CM

## 2018-09-24 DIAGNOSIS — I10 Essential (primary) hypertension: Secondary | ICD-10-CM | POA: Diagnosis not present

## 2018-09-24 DIAGNOSIS — F17210 Nicotine dependence, cigarettes, uncomplicated: Secondary | ICD-10-CM

## 2018-09-24 LAB — NITRIC OXIDE: NITRIC OXIDE: 69

## 2018-09-24 MED ORDER — BUDESONIDE-FORMOTEROL FUMARATE 80-4.5 MCG/ACT IN AERO: 2.0000 | INHALATION_SPRAY | Freq: Two times a day (BID) | RESPIRATORY_TRACT | 11 refills | Status: DC

## 2018-09-24 MED ORDER — BUDESONIDE-FORMOTEROL FUMARATE 80-4.5 MCG/ACT IN AERO: 2.0000 | INHALATION_SPRAY | Freq: Two times a day (BID) | RESPIRATORY_TRACT | 0 refills | Status: DC

## 2018-09-24 MED ORDER — AMOXICILLIN-POT CLAVULANATE 875-125 MG PO TABS: 1.0000 | ORAL_TABLET | Freq: Two times a day (BID) | ORAL | 0 refills | Status: AC

## 2018-09-24 NOTE — Progress Notes (Signed)
Jenna Wood, female    DOB: 1961/04/27,    MRN: [id number]    Brief patient profile:  62 yowf active smoker last child born 1981 with baseline wt of 170 with new onset breathing problems 2015 > Pulmonary eval in [place] copd rx BREO no better but continued use saba hfa 2-3 x per day neb 1-2 x per month and worse sob x spring 2018 so referred to pulmonary clinic 08/13/2018 by Dr   Alphonzo Grieve  PA Dessie Coma with nl spirometry 08/13/2018 on ACEi     History of Present Illness  08/13/2018  Pulmonary/ 1st office eval/ Jenna Wood   Chief Complaint  Patient presents with  . Pulmonary Consult    Referred by Dr. Lillia Corporal. Pt c/o SOB x 18 months. She gets winded walking approx 25 ft. She states she feels like she may have a sinus infection currently- prod cough with yellow sputum. She uses her albuterol inhaler 2-3 x per day and as duoneb that she rarely uses.   Dyspnea:  Progressively worse to point where room / rides cart at grocery store / uses [facility] parking  Cough: x one week congested/ rattly with min ywllow mucus esp in am's assoc with nasal congestion/ slt purulent d/c Sleep: bed flat/ 3 pillows  SABA use:  2-3 x per day rec Plan A = Automatic = stop lisinopril and start micardis 40 mg one each am and change omeparzole to 20 mg Take 30-60 min before first meal of the day  zpak  Plan B = Backup Only use your albuterol (Ventolin) as a rescue medication Plan C = Crisis - only use your albuterol nebulizer if you first try Plan B and it fails to help > ok to use the nebulizer up to every 4 hours but if start needing it regularly call for immediate appointment      09/24/2018  f/u ov/Jenna Wood re:  Cough variant asthma vs uacs/ still smoking  Chief Complaint  Patient presents with  . Follow-up    Breathing has improved some. She has not had to use her albuterol. Cough has also improved.   Dyspnea:  Rides the scooter due to back Cough: immediately every night > clear mucus, most night and in am    Sleeping: flat bed / sev pillows/ no bed blocks  SABA use: not using at all  02: none   No obvious day to day or daytime variability or assoc excess/ purulent sputum or mucus plugs or hemoptysis or cp or chest tightness, subjective wheeze or overt sinus or hb symptoms.   Also denies any obvious fluctuation of symptoms with weather or environmental changes or other aggravating or alleviating factors except as outlined above   No unusual exposure hx or h/o childhood pna/ asthma or knowledge of premature birth.  Current Allergies, Complete Past Medical History, Past Surgical History, Family History, and Social History were reviewed in [employer] record.  ROS  The following are not active complaints unless bolded Hoarseness, sore throat, dysphagia, dental problems, itching, sneezing,  nasal congestion or discharge of excess mucus or purulent secretions, ear ache,   fever, chills, sweats, unintended wt loss or wt gain, classically pleuritic or exertional cp,  orthopnea pnd or arm/hand swelling  or leg swelling, presyncope, palpitations, abdominal pain, anorexia, nausea, vomiting, diarrhea  or change in bowel habits or change in bladder habits, change in stools or change in urine, dysuria, hematuria,  rash, arthralgias, visual complaints, headache, numbness, weakness or ataxia  or problems with walking or coordination,  change in mood or  memory.        Current Meds  Medication Sig  . acetaminophen (TYLENOL) 325 MG tablet Take 2 tablets (650 mg total) by mouth every 6 (six) hours as needed for mild pain or moderate pain.  Marland Kitchen albuterol (PROVENTIL HFA;VENTOLIN HFA) 108 (90 BASE) MCG/ACT inhaler Inhale 2 puffs into the lungs every 4 (four) hours as needed for wheezing or shortness of breath.   Marland Kitchen buPROPion (WELLBUTRIN SR) 100 MG 12 hr tablet TAKE 1 TABLET BY MOUTH EVERY MORNING FOR 1 WEEK THEN INCREASE TO 1 TABLET 2 TIMES DAILY  . busPIRone (BUSPAR) 15 MG tablet Take 15 mg by  mouth 3 (three) times daily.  . colesevelam (WELCHOL) 625 MG tablet Take 1 tablet (625 mg total) by mouth 2 (two) times daily with a meal for 252 doses. Take 3 tablets by mouth twice daily for 6 weeks.  . cyclobenzaprine (FLEXERIL) 10 MG tablet Take 10 mg by mouth at bedtime.  . DULoxetine (CYMBALTA) 60 MG capsule Take 60 mg by mouth 2 (two) times daily.  . furosemide (LASIX) 40 MG tablet Take 40 mg by mouth daily.  Marland Kitchen ipratropium-albuterol (DUONEB) 0.5-2.5 (3) MG/3ML SOLN 1 vial in nebulizer every 4 hours as needed  . LYRICA 150 MG capsule Take 150 mg by mouth 2 (two) times daily.  Marland Kitchen omeprazole (PRILOSEC) 20 MG capsule Take 30- 60 min before your first and last meals of the day  . pentoxifylline (TRENTAL) 400 MG CR tablet Take 400 mg by mouth 3 (three) times daily.  . potassium chloride SA (K-DUR,KLOR-CON) 20 MEQ tablet Take 20 mEq by mouth daily.  Marland Kitchen telmisartan (MICARDIS) 40 MG tablet Take 1 tablet (40 mg total) by mouth daily.  . TRAVATAN Z 0.004 % SOLN ophthalmic solution Place 1 drop into both eyes at bedtime.                             Objective:    amb obese wf nad    Wt Readings from Last 3 Encounters:  09/24/18 286 lb 3.2 oz (129.8 kg)  09/23/18 278 lb (126.1 kg)  09/03/18 278 lb 9.6 oz (126.4 kg)       Vital signs reviewed - Note on arrival 02 sats  95% on RA and BP 126/82        HEENT: nl  oropharynx. Nl external ear canals without cough reflex  -  moderate bilateral non-specific turbinate edema  / slt mp secretions R> L/   Top plate and lower partials   NECK :  without JVD/Nodes/TM/ nl carotid upstrokes bilaterally   LUNGS: no acc muscle use,  Nl contour chest with a few inps/exp rhonchi bilaterally and slt decreased bs on R without cough on insp or exp maneuvers   CV:  RRR  no s3 or murmur or increase in P2, and no edema   ABD:  Quite obese  nontender with limited inspiratory excursion in the supine position. No bruits or organomegaly appreciated,  bowel sounds nl  MS:  Nl gait/ ext warm without deformities, calf tenderness, cyanosis or clubbing No obvious joint restrictions   SKIN: warm and dry without lesions    NEURO:  alert, approp, nl sensorium with  no motor or cerebellar deficits apparent.           Assessment

## 2018-09-24 NOTE — Assessment & Plan Note (Signed)
Spirometry 08/13/2018  FEV1 2.0 (79%)  Ratio 84 s prior  rx and flat effort dep portion of f/v loop  - 08/13/2018  Walked RA x 3 laps @ 185 ft each stopped due to end of study, fast pace, no desat, min sob    More limited by back pain than doe but clearly also related to obesity/ conditioning but not by airflow obst.

## 2018-09-24 NOTE — Assessment & Plan Note (Addendum)
Body mass index is 50.7 kg/m.  -  trending up  No results found for: TSH   Contributing to gerd risk/ doe and makes the R HD issue more symptomatic for sure  >>> Reviewed the need and the process to achieve and maintain neg calorie balance > defer f/u primary care including intermittently monitoring thyroid status      I had an extended discussion with the patient reviewing all relevant studies completed to date and  lasting 15 to 20 minutes of a 25 minute visit    See device teaching which extended face to face time for this visit.  Each maintenance medication was reviewed in detail including emphasizing most importantly the difference between maintenance and prns and under what circumstances the prns are to be triggered using an action plan format that is not reflected in the computer generated alphabetically organized AVS which I have not found useful in most complex patients, especially with respiratory illnesses  Please see AVS for specific instructions unique to this visit that I personally wrote and verbalized to the the pt in detail and then reviewed with pt  by my nurse highlighting any  changes in therapy recommended at today's visit to their plan of care.

## 2018-09-24 NOTE — Telephone Encounter (Signed)
  Follow up Call-  Call back number 09/23/2018  Post procedure Call Back phone  # [phone]  Permission to leave phone message Yes  Some recent data might be hidden     Patient questions:  Do you have a fever, pain , or abdominal swelling? No. Pain Score  0 *  Have you tolerated food without any problems? Yes.    Have you been able to return to your normal activities? Yes.    Do you have any questions about your discharge instructions: Diet   No. Medications  No. Follow up visit  No.  Do you have questions or concerns about your Care? No.  Actions: * If pain score is 4 or above: No action needed, pain <4.

## 2018-09-24 NOTE — Assessment & Plan Note (Signed)
Spirometry 08/13/2018  FEV1 2.0 (79%)  Ratio 84 s prior  rx and flat effort dep portion of f/v loop  FENO 09/24/2018  =  69 - 09/24/2018  After extensive coaching inhaler device,  effectiveness =   90% >>>  Try symbicort 80 2bid targeting noct cough     DDX of  difficult airways management almost all start with A and  include Adherence, Ace Inhibitors, Acid Reflux, Active Sinus Disease, Alpha 1 Antitripsin deficiency, Anxiety masquerading as Airways dz,  ABPA,  Allergy(esp in young), Aspiration (esp in elderly), Adverse effects of meds,  Active smoking or vaping, A bunch of PE's (a small clot burden can't cause this syndrome unless there is already severe underlying pulm or vascular dz with poor reserve) plus two Bs  = Bronchiectasis and Beta blocker use..and one C= CHF   Adherence is always the initial "prime suspect" and is a multilayered concern that requires a "trust but verify" approach in every patient - starting with knowing how to use medications, especially inhalers, correctly, keeping up with refills and understanding the fundamental difference between maintenance and prns vs those medications only taken for a very short course and then stopped and not refilled.  - see hfa teaching  Active smoking (see separate a/p)   ? Acid (or non-acid) GERD > always difficult to exclude as up to 75% of pts in some series report no assoc GI/ Heartburn symptoms> rec continue max (24h)  acid suppression and diet restrictions/ reviewed  >>> needs to add bedblocks for noct cough   ? Allergy/asthma suggested by FENO despite nl spirometry > try symbicort 80 2bid   ? Active sinus dz > if not better take augmetin x 10 days then sinus ct next

## 2018-09-24 NOTE — Assessment & Plan Note (Signed)
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

## 2018-09-24 NOTE — Assessment & Plan Note (Signed)
4-5 min discussion re active cigarette smoking in addition to office E&M  Ask about tobacco use:   ongoing Advise quitting:  Advised coughing from smoking, her main complaint now, typically resolves over 6 weeks of stop date Assess willingness:  Not committed at this point Assist in quit attempt:  Per PCP when ready Arrange follow up:   Follow up per Primary Care planned

## 2018-09-24 NOTE — Patient Instructions (Addendum)
Plan A = Automatic = Symbicort 80 Take 2 puffs first thing in am and then another 2 puffs about 12 hours later.     Work on inhaler technique:  relax and gently blow all the way out then take a nice smooth deep breath back in, triggering the inhaler at same time you start breathing in.  Hold for up to 5 seconds if you can. Blow out thru nose. Rinse and gargle with water when done    Plan B = Backup Only use your albuterol inhaler as a rescue medication to be used if you can't catch your breath by resting or doing a relaxed purse lip breathing pattern.  - The less you use it, the better it will work when you need it. - Ok to use the inhaler up to 2 puffs  every 4 hours if you must but call for appointment if use goes up over your usual need - Don't leave home without it !!  (think of it like the spare tire for your car)   Plan C = Crisis - only use your albuterol/ipatropium nebulizer if you first try Plan B and it fails to help > ok to use the nebulizer up to every 4 hours but if start needing it regularly call for immediate appointment   If not improving with night time cough after 3-4 days, go ahead and take augmentin   875 mg take one pill twice daily  X 10 days - take at breakfast and supper with large glass of water.  It would help reduce the usual side effects (diarrhea and yeast infections) if you ate cultured yogurt at lunch.     Please schedule a follow up office visit in 4 weeks, sooner if needed

## 2018-10-10 ENCOUNTER — Encounter: Payer: Self-pay | Admitting: Gastroenterology

## 2018-10-22 ENCOUNTER — Ambulatory Visit: Payer: Medicare HMO | Admitting: Internal Medicine

## 2018-10-22 ENCOUNTER — Encounter: Payer: Self-pay | Admitting: Internal Medicine

## 2018-10-22 VITALS — BP 108/80 | HR 87 | Ht 65.0 in | Wt 294.0 lb

## 2018-10-22 DIAGNOSIS — J45991 Cough variant asthma: Secondary | ICD-10-CM

## 2018-10-22 DIAGNOSIS — F17210 Nicotine dependence, cigarettes, uncomplicated: Secondary | ICD-10-CM | POA: Diagnosis not present

## 2018-10-22 MED ORDER — BUDESONIDE-FORMOTEROL FUMARATE 160-4.5 MCG/ACT IN AERO: 2.0000 | INHALATION_SPRAY | Freq: Two times a day (BID) | RESPIRATORY_TRACT | 11 refills | Status: DC

## 2018-10-22 MED ORDER — BUDESONIDE-FORMOTEROL FUMARATE 160-4.5 MCG/ACT IN AERO: 2.0000 | INHALATION_SPRAY | Freq: Two times a day (BID) | RESPIRATORY_TRACT | 0 refills | Status: DC

## 2018-10-22 NOTE — Patient Instructions (Signed)
For drainage / throat tickle try take CHLORPHENIRAMINE  4 mg (Chlor tab ) - take one every 4 hours as needed - available over the counter- may cause drowsiness so start with just a dose or two about an hour before and see how you tolerate it before trying in daytime     Try increase symbicort to 160 Take 2 puffs first thing in am and then another 2 puffs about 12 hours later.    Work on inhaler technique:  relax and gently blow all the way out then take a nice smooth deep breath back in, triggering the inhaler at same time you start breathing in.  Hold for up to 5 seconds if you can. Blow out thru nose. Rinse and gargle with water when done      The key is to stop smoking completely before smoking completely stops you!    Please schedule a follow up office visit in 6 weeks, call sooner if needed

## 2018-10-22 NOTE — Progress Notes (Signed)
Jenna Wood, female    DOB: 1961-01-29,    MRN: [id number]    Brief patient profile:  11 yowf active smoker last child born 1981 with baseline wt of 170 with new onset breathing problems 2015 > Pulmonary eval in [place] dx copd rx BREO no better but continued use saba hfa 2-3 x per day neb 1-2 x per month and worse sob x spring 2018 so referred to pulmonary clinic 08/13/2018 by Dr   Alphonzo Grieve  PA Dessie Coma with nl spirometry 08/13/2018 on ACEi     History of Present Illness  08/13/2018  Pulmonary/ 1st office eval/ Jenna Wood   Chief Complaint  Patient presents with  . Pulmonary Consult    Referred by Dr. Lillia Corporal. Pt c/o SOB x 18 months. She gets winded walking approx 25 ft. She states she feels like she may have a sinus infection currently- prod cough with yellow sputum. She uses her albuterol inhaler 2-3 x per day and as duoneb that she rarely uses.   Dyspnea:  Progressively worse to point where room / rides cart at grocery store / uses [facility] parking  Cough: x one week congested/ rattly with min ywllow mucus esp in am's assoc with nasal congestion/ slt purulent d/c Sleep: bed flat/ 3 pillows  SABA use:  2-3 x per day rec Plan A = Automatic = stop lisinopril and start micardis 40 mg one each am and change omeparzole to 20 mg Take 30-60 min before first meal of the day  zpak  Plan B = Backup Only use your albuterol (Ventolin) as a rescue medication Plan C = Crisis - only use your albuterol nebulizer if you first try Plan B and it fails to help > ok to use the nebulizer up to every 4 hours but if start needing it regularly call for immediate appointment      09/24/2018  f/u ov/Jenna Wood re:  Cough variant asthma vs uacs/ still smoking  Chief Complaint  Patient presents with  . Follow-up    Breathing has improved some. She has not had to use her albuterol. Cough has also improved.   Dyspnea:  Rides the scooter due to back Cough: immediately every night > clear mucus, most night and in am    Sleeping: flat bed / sev pillows/ no bed blocks  SABA use: not using at all  02: none rec Plan A = Automatic = Symbicort 80 Take 2 puffs first thing in am and then another 2 puffs about 12 hours later.  Work on inhaler technique:   Plan B = Backup Only use your albuterol inhaler as a rescue medication Plan C = Crisis - only use your albuterol/ipatropium nebulizer if you first try Plan B and it fails to help > ok to use the nebulizer up to every 4 hours but if start needing it regularly call for immediate appointment If not improving with night time cough after 3-4 days, go ahead and take augmentin   875 mg take one pill twice daily  X 10 days - take at breakfast and supper with large glass of water.  It would help reduce the usual side effects (diarrhea and yeast infections) if you ate cultured yogurt at lunch.   Please schedule a follow up office visit in 4 weeks, sooner if needed       10/22/2018  f/u ov/Jenna Wood re:  uacs vs asthma / [place] - worse off symb 80 2bid  Chief Complaint  Patient presents with  .  Follow-up    Breathing is about the same. She has been coughing more and wheezing- prod with clear sputum. She is using her rescue inhaler 2 x daily on average. She has not used her neb.  Dyspnea:  Back stops from walking before breathing  Cough: clear mucus as soon as lie down  Assoc with subjective wheeze  Sleeping: sleeps on couch 30 degrees x 9 m SABA use: increased since stopped symb 02: no    No obvious day to day or daytime variability or assoc   purulent sputum or mucus plugs or hemoptysis or cp or chest tightness, subjective wheeze or overt sinus or hb symptoms.     Also denies any obvious fluctuation of symptoms with weather or environmental changes or other aggravating or alleviating factors except as outlined above   No unusual exposure hx or h/o childhood pna/ asthma or knowledge of premature birth.  Current Allergies, Complete Past Medical History, Past Surgical  History, Family History, and Social History were reviewed in [employer] record.  ROS  The following are not active complaints unless bolded Hoarseness, sore throat, dysphagia, dental problems, itching, sneezing,  nasal congestion or discharge of excess mucus or purulent secretions, ear ache,   fever, chills, sweats, unintended wt loss or wt gain, classically pleuritic or exertional cp,  orthopnea pnd or arm/hand swelling  or leg swelling, presyncope, palpitations, abdominal pain, anorexia, nausea, vomiting, diarrhea  or change in bowel habits or change in bladder habits, change in stools or change in urine, dysuria, hematuria,  rash, arthralgias, visual complaints, headache, numbness, weakness or ataxia or problems with walking or coordination,  change in mood= c/o anxious and depressed or  memory.        Current Meds  Medication Sig  . acetaminophen (TYLENOL) 325 MG tablet Take 2 tablets (650 mg total) by mouth every 6 (six) hours as needed for mild pain or moderate pain.  Marland Kitchen albuterol (PROVENTIL HFA;VENTOLIN HFA) 108 (90 BASE) MCG/ACT inhaler Inhale 2 puffs into the lungs every 4 (four) hours as needed for wheezing or shortness of breath.   Marland Kitchen buPROPion (WELLBUTRIN SR) 100 MG 12 hr tablet TAKE 1 TABLET BY MOUTH EVERY MORNING FOR 1 WEEK THEN INCREASE TO 1 TABLET 2 TIMES DAILY  . busPIRone (BUSPAR) 15 MG tablet Take 15 mg by mouth 3 (three) times daily.  . colesevelam (WELCHOL) 625 MG tablet Take 1 tablet (625 mg total) by mouth 2 (two) times daily with a meal for 252 doses. Take 3 tablets by mouth twice daily for 6 weeks.  . cyclobenzaprine (FLEXERIL) 10 MG tablet Take 10 mg by mouth at bedtime.  . DULoxetine (CYMBALTA) 60 MG capsule Take 60 mg by mouth 2 (two) times daily.  . furosemide (LASIX) 40 MG tablet Take 40 mg by mouth daily.  Marland Kitchen ipratropium-albuterol (DUONEB) 0.5-2.5 (3) MG/3ML SOLN 1 vial in nebulizer every 4 hours as needed  . LYRICA 150 MG capsule Take 150 mg by  mouth 2 (two) times daily.  Marland Kitchen omeprazole (PRILOSEC) 20 MG capsule Take 30- 60 min before your first and last meals of the day  . pentoxifylline (TRENTAL) 400 MG CR tablet Take 400 mg by mouth 3 (three) times daily.  . potassium chloride SA (K-DUR,KLOR-CON) 20 MEQ tablet Take 20 mEq by mouth daily.  Marland Kitchen telmisartan (MICARDIS) 40 MG tablet Take 1 tablet (40 mg total) by mouth daily.  . TRAVATAN Z 0.004 % SOLN ophthalmic solution Place 1 drop into both eyes at  bedtime.  .                        Objective:      amb obese  wf congested/rattling cough   nad    10/22/2018    294   09/24/18 286 lb 3.2 oz (129.8 kg)  09/23/18 278 lb (126.1 kg)  09/03/18 278 lb 9.6 oz (126.4 kg)        Vital signs reviewed - Note on arrival 02 sats  100% on RA      HEENT: nl  oropharynx. Nl external ear canals without cough reflex  -  moderate bilateral non-specific turbinate edema  / slt mp secretions R> L/   Top plate and lower partials     LUNGS: no acc muscle use,  Nl contour chest with a few inps/exp rhonchi bilaterally and slt decreased bs on R without cough on insp or exp maneuvers     HEENT: nl   oropharynx. Nl external ear canals without cough reflex - top dentures/ lower partial and mild  bilateral non-specific turbinate edema     NECK :  without JVD/Nodes/TM/ nl carotid upstrokes bilaterally   LUNGS: no acc muscle use,  Nl contour chest insp/exp rhonchi  bilaterally without cough on insp or exp maneuvers   CV:  RRR  no s3 or murmur or increase in P2, and no edema   ABD:  Obese/ nontender with poor inspiratory excursion in the supine position. No bruits or organomegaly appreciated, bowel sounds nl  MS:  Nl gait/ ext warm without deformities, calf tenderness, cyanosis or clubbing No obvious joint restrictions   SKIN: warm and dry without lesions    NEURO:  alert, approp, nl sensorium with  no motor or cerebellar deficits apparent.         Assessment

## 2018-10-24 ENCOUNTER — Encounter: Payer: Self-pay | Admitting: Internal Medicine

## 2018-10-24 NOTE — Assessment & Plan Note (Signed)
Spirometry 08/13/2018  FEV1 2.0 (79%)  Ratio 84 s prior  rx and flat effort dep portion of f/v loop  FENO 09/24/2018  =  69 - 09/24/2018    Try symbicort 80 2bid targeting noct cough > worse 10/22/2018 though not c/w symb  - 10/22/2018  After extensive coaching inhaler device,  effectiveness =    75% > resume symb 160 2 bid  And add 1st gen H1 blockers per guidelines  For noct cough   Suspect she both asthma and uacs so try rx for both as above   >>> f/u 6 weeks with all meds in hand using a trust but verify approach to confirm accurate Medication  Reconciliation The principal here is that until we are certain that the  patients are doing what we've asked, it makes no sense to ask them to do more.

## 2018-10-24 NOTE — Assessment & Plan Note (Signed)
Counseled re importance of smoking cessation but did not meet time criteria for separate billing   °

## 2018-10-24 NOTE — Assessment & Plan Note (Signed)
Body mass index is 48.92 kg/m.  -  trending up  No results found for: TSH   Contributing to gerd risk/ doe/reviewed the need and the process to achieve and maintain neg calorie balance > defer f/u primary care including intermittently monitoring thyroid status     I had an extended discussion with the patient reviewing all relevant studies completed to date and  lasting 15 to 20 minutes of a 25 minute visit    See device teaching which extended face to face time for this visit.  Each maintenance medication was reviewed in detail including emphasizing most importantly the difference between maintenance and prns and under what circumstances the prns are to be triggered using an action plan format that is not reflected in the computer generated alphabetically organized AVS which I have not found useful in most complex patients, especially with respiratory illnesses  Please see AVS for specific instructions unique to this visit that I personally wrote and verbalized to the the pt in detail and then reviewed with pt  by my nurse highlighting any  changes in therapy recommended at today's visit to their plan of care.

## 2018-12-03 ENCOUNTER — Ambulatory Visit: Payer: Medicare HMO | Admitting: Internal Medicine

## 2019-05-27 ENCOUNTER — Other Ambulatory Visit: Payer: Self-pay | Admitting: Internal Medicine

## 2019-08-17 ENCOUNTER — Other Ambulatory Visit: Payer: Self-pay | Admitting: Internal Medicine

## 2019-08-17 DIAGNOSIS — Z1231 Encounter for screening mammogram for malignant neoplasm of breast: Secondary | ICD-10-CM

## 2019-09-30 ENCOUNTER — Ambulatory Visit: Payer: Medicare HMO

## 2019-11-21 ENCOUNTER — Ambulatory Visit
Admission: RE | Admit: 2019-11-21 | Discharge: 2019-11-21 | Disposition: A | Discharge status: Home / Self Care | Payer: Medicare HMO | Source: Ambulatory Visit | Attending: Internal Medicine | Admitting: Internal Medicine

## 2019-11-21 ENCOUNTER — Other Ambulatory Visit: Payer: Self-pay

## 2019-11-21 DIAGNOSIS — Z1231 Encounter for screening mammogram for malignant neoplasm of breast: Secondary | ICD-10-CM

## 2019-11-23 ENCOUNTER — Other Ambulatory Visit: Payer: Self-pay | Admitting: Internal Medicine

## 2019-11-23 DIAGNOSIS — R928 Other abnormal and inconclusive findings on diagnostic imaging of breast: Secondary | ICD-10-CM

## 2019-11-29 ENCOUNTER — Ambulatory Visit
Admission: RE | Admit: 2019-11-29 | Discharge: 2019-11-29 | Disposition: A | Discharge status: Home / Self Care | Payer: Medicare HMO | Source: Ambulatory Visit | Attending: Internal Medicine | Admitting: Internal Medicine

## 2019-11-29 ENCOUNTER — Other Ambulatory Visit: Payer: Self-pay | Admitting: Internal Medicine

## 2019-11-29 ENCOUNTER — Other Ambulatory Visit: Payer: Self-pay

## 2019-11-29 DIAGNOSIS — R928 Other abnormal and inconclusive findings on diagnostic imaging of breast: Secondary | ICD-10-CM

## 2019-11-29 DIAGNOSIS — N6310 Unspecified lump in the right breast, unspecified quadrant: Secondary | ICD-10-CM

## 2020-01-11 ENCOUNTER — Encounter: Payer: Self-pay | Admitting: Cardiovascular Disease

## 2020-04-13 ENCOUNTER — Telehealth: Payer: Self-pay

## 2020-04-13 NOTE — Telephone Encounter (Signed)
NOTES ON FILE FROM [facility] [phone], SENT REFERRAL TO SCHEDULING 

## 2020-04-17 ENCOUNTER — Encounter: Payer: Self-pay | Admitting: Internal Medicine

## 2020-04-27 ENCOUNTER — Other Ambulatory Visit: Payer: Self-pay | Admitting: General Practice

## 2020-05-16 ENCOUNTER — Encounter (HOSPITAL_COMMUNITY): Payer: Self-pay | Admitting: *Deleted

## 2020-05-16 ENCOUNTER — Emergency Department (HOSPITAL_COMMUNITY): Payer: Medicare HMO

## 2020-05-16 ENCOUNTER — Other Ambulatory Visit: Payer: Self-pay

## 2020-05-16 ENCOUNTER — Emergency Department (HOSPITAL_COMMUNITY)
Admission: EM | Admit: 2020-05-16 | Discharge: 2020-05-16 | Disposition: A | Discharge status: Home / Self Care | Payer: Medicare HMO | Attending: Emergency Medicine | Admitting: Emergency Medicine

## 2020-05-16 DIAGNOSIS — Z7951 Long term (current) use of inhaled steroids: Secondary | ICD-10-CM | POA: Diagnosis not present

## 2020-05-16 DIAGNOSIS — M5431 Sciatica, right side: Secondary | ICD-10-CM | POA: Diagnosis not present

## 2020-05-16 DIAGNOSIS — Z79899 Other long term (current) drug therapy: Secondary | ICD-10-CM | POA: Diagnosis not present

## 2020-05-16 DIAGNOSIS — G8929 Other chronic pain: Secondary | ICD-10-CM | POA: Insufficient documentation

## 2020-05-16 DIAGNOSIS — F17210 Nicotine dependence, cigarettes, uncomplicated: Secondary | ICD-10-CM | POA: Insufficient documentation

## 2020-05-16 DIAGNOSIS — I10 Essential (primary) hypertension: Secondary | ICD-10-CM | POA: Insufficient documentation

## 2020-05-16 DIAGNOSIS — L03115 Cellulitis of right lower limb: Secondary | ICD-10-CM | POA: Insufficient documentation

## 2020-05-16 DIAGNOSIS — M545 Low back pain: Secondary | ICD-10-CM | POA: Diagnosis present

## 2020-05-16 DIAGNOSIS — J449 Chronic obstructive pulmonary disease, unspecified: Secondary | ICD-10-CM | POA: Diagnosis not present

## 2020-05-16 DIAGNOSIS — Z23 Encounter for immunization: Secondary | ICD-10-CM | POA: Diagnosis not present

## 2020-05-16 MED ORDER — SULFAMETHOXAZOLE-TRIMETHOPRIM 800-160 MG PO TABS
1.0000 | ORAL_TABLET | Freq: Once | ORAL | Status: AC
  Administered 2020-05-16: 1 via ORAL
  Filled 2020-05-16: qty 1

## 2020-05-16 MED ORDER — HYDROCODONE-ACETAMINOPHEN 5-325 MG PO TABS: 1.0000 | ORAL_TABLET | ORAL | 0 refills | Status: DC | PRN

## 2020-05-16 MED ORDER — HYDROCODONE-ACETAMINOPHEN 5-325 MG PO TABS
1.0000 | ORAL_TABLET | Freq: Once | ORAL | Status: AC
  Administered 2020-05-16: 1 via ORAL
  Filled 2020-05-16: qty 1

## 2020-05-16 MED ORDER — SULFAMETHOXAZOLE-TRIMETHOPRIM 800-160 MG PO TABS: 1.0000 | ORAL_TABLET | Freq: Two times a day (BID) | ORAL | 0 refills | Status: AC

## 2020-05-16 MED ORDER — TETANUS-DIPHTH-ACELL PERTUSSIS 5-2.5-18.5 LF-MCG/0.5 IM SUSP
0.5000 mL | Freq: Once | INTRAMUSCULAR | Status: AC
  Administered 2020-05-16: 0.5 mL via INTRAMUSCULAR
  Filled 2020-05-16: qty 0.5

## 2020-05-16 NOTE — Discharge Instructions (Addendum)
Continue taking your ibuprofen and flexeril. You have been prescribed hydrocodone for additional pain relief.  Do not drive within 4 hours of taking this medicine as this will make you drowsy.  Avoid lifting,  Bending,  Twisting or any other activity that worsens your pain over the next week.  Apply a heting pad  to your lower back for 20 minutes several times daily.   You should get rechecked if your symptoms are not better over the next 5 days,  Or you develop increased pain,  Weakness in your leg(s) or loss of bladder or bowel function - these can be symptoms of a worsening condition.  Also, take the entire course of the antibiotics for the skin infection on your right lower leg.  A gentle heating pad  and elevation can also help improvement.  Get rechecked for any worsening pain, spreading redness or development of fever.

## 2020-05-16 NOTE — ED Provider Notes (Signed)
[facility] EMERGENCY DEPARTMENT Provider Note   CSN: [id number] Arrival date & time: 05/16/20  1141     History Chief Complaint  Patient presents with  . Back Pain    Jenna Wood is a 59 y.o. female with a history of chronic low back pain with known degenerative joint disease, fibromyalgia, GERD, hypertension and history of chronic low back pain presenting with new radiation of pain into her right heel which starts in her right buttock region after bending down to untie a bag that was sitting on the floor 4 days ago.  When she stood up she had a sudden grabbing sensation in her mid lower back.  She has been using ibuprofen and Flexeril which has been helpful.  She was feeling much better yesterday, but then twisted her torso to pet one of her dogs and her pain returned and is now worse.  She denies weakness or numbness in her legs, also no urinary or fecal incontinence or retention.  She does continue to use Flexeril and ibuprofen.  Of note, she also has redness and swelling of her right lower leg which she first noticed several days after she sustained a scratch from one of her dogs pause on her right lower leg.  She has been applying Neosporin to the abrasion site which is improving.  She does have localized tenderness and is noticed several small blisters developing at the site.  She denies fevers or chills, nausea, vomiting or any other complaints.  The history is provided by the patient.       Past Medical History:  Diagnosis Date  . Anxiety   . Chronic back pain   . COPD (chronic obstructive pulmonary disease) ([place])   . Depression   . DJD (degenerative joint disease)   . Fibromyalgia   . Gallstones   . GERD (gastroesophageal reflux disease)   . Hyperlipidemia   . Hypertension   . Migraine   . Pancreatitis     Patient Active Problem List   Diagnosis Date Noted  . Cough variant asthma vs UACS 09/24/2018  . Cigarette smoker 09/24/2018  . Essential hypertension  08/14/2018  . Morbid obesity due to excess calories ([place]) 08/14/2018  . DOE (dyspnea on exertion) 08/13/2018    Past Surgical History:  Procedure Laterality Date  . CHOLECYSTECTOMY    . WISDOM TOOTH EXTRACTION       OB History   No obstetric history on file.     Family History  Problem Relation Age of Onset  . Lung cancer Father   . Lung cancer Brother   . Diabetes Mother   . Hypertension Mother   . Anemia Mother   . Breast cancer Maternal Aunt   . Colon cancer Neg Hx   . Esophageal cancer Neg Hx   . Pancreatic cancer Neg Hx   . Stomach cancer Neg Hx   . Liver disease Neg Hx     Social History   Tobacco Use  . Smoking status: Current Every Day Smoker    Packs/day: 1.00    Years: 40.00    Pack years: 40.00    Types: Cigarettes  . Smokeless tobacco: Never Used  Vaping Use  . Vaping Use: Former  . Start date: 11/10/2013  . Quit date: 11/10/2014  Substance Use Topics  . Alcohol use: No  . Drug use: No    Home Medications Prior to Admission medications   Medication Sig Start Date End Date Taking? Authorizing Provider  acetaminophen (TYLENOL) 325  MG tablet Take 2 tablets (650 mg total) by mouth every 6 (six) hours as needed for mild pain or moderate pain. 02/25/18   Avie Echevaria B, PA-C  albuterol (PROVENTIL HFA;VENTOLIN HFA) 108 (90 BASE) MCG/ACT inhaler Inhale 2 puffs into the lungs every 4 (four) hours as needed for wheezing or shortness of breath.     [provider]  budesonide-formoterol (SYMBICORT) 160-4.5 MCG/ACT inhaler Inhale 2 puffs into the lungs 2 (two) times daily. 10/22/18   Tanda Rockers, MD  buPROPion (WELLBUTRIN SR) 100 MG 12 hr tablet TAKE 1 TABLET BY MOUTH EVERY MORNING FOR 1 WEEK THEN INCREASE TO 1 TABLET 2 TIMES DAILY 02/15/18   [provider]  busPIRone (BUSPAR) 15 MG tablet Take 15 mg by mouth 3 (three) times daily.    [provider]  colesevelam (WELCHOL) 625 MG tablet Take 1 tablet (625 mg total) by mouth 2  (two) times daily with a meal for 252 doses. Take 3 tablets by mouth twice daily for 6 weeks. 09/23/18 01/27/19  Thornton Park, MD  cyclobenzaprine (FLEXERIL) 10 MG tablet Take 10 mg by mouth at bedtime. 03/11/16   [provider]  DULoxetine (CYMBALTA) 60 MG capsule Take 60 mg by mouth 2 (two) times daily.    [provider]  furosemide (LASIX) 40 MG tablet Take 40 mg by mouth daily.    [provider]  HYDROcodone-acetaminophen (NORCO/VICODIN) 5-325 MG tablet Take 1 tablet by mouth every 4 (four) hours as needed for moderate pain. 05/16/20   Evalee Jefferson, PA-C  ipratropium-albuterol (DUONEB) 0.5-2.5 (3) MG/3ML SOLN 1 vial in nebulizer every 4 hours as needed 07/27/18   [provider]  LYRICA 150 MG capsule Take 150 mg by mouth 2 (two) times daily. 03/12/16   [provider]  omeprazole (PRILOSEC) 20 MG capsule Take 30- 60 min before your first and last meals of the day 08/13/18   Tanda Rockers, MD  pentoxifylline (TRENTAL) 400 MG CR tablet Take 400 mg by mouth 3 (three) times daily. 03/13/16   [provider]  potassium chloride SA (K-DUR,KLOR-CON) 20 MEQ tablet Take 20 mEq by mouth daily.    [provider]  sulfamethoxazole-trimethoprim (BACTRIM DS) 800-160 MG tablet Take 1 tablet by mouth 2 (two) times daily for 10 days. 05/16/20 05/26/20  Evalee Jefferson, PA-C  telmisartan (MICARDIS) 40 MG tablet Take 1 tablet (40 mg total) by mouth daily. 08/13/18   Tanda Rockers, MD  TRAVATAN Z 0.004 % SOLN ophthalmic solution Place 1 drop into both eyes at bedtime. 06/02/16   [provider]    Allergies    Patient has no known allergies.  Review of Systems   Review of Systems  Constitutional: Negative for chills and fever.  HENT: Negative for congestion and sore throat.   Eyes: Negative.   Respiratory: Negative for chest tightness and shortness of breath.   Cardiovascular: Negative for chest pain and leg swelling.  Gastrointestinal:  Negative for abdominal distention, abdominal pain, constipation and nausea.  Genitourinary: Negative.   Musculoskeletal: Positive for back pain. Negative for arthralgias, gait problem, joint swelling and neck pain.  Skin: Positive for color change and wound. Negative for rash.  Neurological: Negative for dizziness, weakness, light-headedness, numbness and headaches.  Psychiatric/Behavioral: Negative.     Physical Exam Updated Vital Signs BP (!) 112/58 (BP Location: Left Arm)   Pulse 78   Temp 97.7 F (36.5 C) (Oral)   Resp 18   Ht 5\' 5"  (1.651  m)   Wt 136.1 kg   SpO2 94%   BMI 49.92 kg/m   Physical Exam Vitals and nursing note reviewed.  Constitutional:      Appearance: She is well-developed.  HENT:     Head: Normocephalic.  Eyes:     Conjunctiva/sclera: Conjunctivae normal.  Cardiovascular:     Rate and Rhythm: Normal rate.     Comments: Pedal pulses normal. Pulmonary:     Effort: Pulmonary effort is normal.  Abdominal:     General: Bowel sounds are normal. There is no distension.     Palpations: Abdomen is soft. There is no mass.  Musculoskeletal:        General: Swelling present. Normal range of motion.     Cervical back: Normal range of motion and neck supple.     Lumbar back: Tenderness present. No swelling, edema or spasms.  Skin:    General: Skin is warm and dry.     Findings: Erythema present.     Comments: Well-healing abrasion on the right lower medial leg.  There is anterior tibial erythema, several areas of fine intact vesicles.  She has moderate edema 1+ to her right upper anterior tibia.  Dorsalis pedal pulses are intact.  Neurological:     Mental Status: She is alert.     Sensory: No sensory deficit.     Motor: No tremor or atrophy.     Gait: Gait normal.     Deep Tendon Reflexes:     Reflex Scores:      Patellar reflexes are 2+ on the right side and 2+ on the left side.    Comments: No strength deficit noted in hip and knee flexor and extensor  muscle groups.  Ankle flexion and extension intact.Pain elicited mid right anterior tibia with right ankle flexion.     ED Results / Procedures / Treatments   Labs (all labs ordered are listed, but only abnormal results are displayed) Labs Reviewed - No data to display  EKG None  Radiology DG Lumbar Spine Complete  Result Date: 05/16/2020 CLINICAL DATA:  Acute low back and right leg pain without known injury. EXAM: LUMBAR SPINE - COMPLETE 4+ VIEW COMPARISON:  None. FINDINGS: No fracture or spondylolisthesis is noted. Moderate degenerative disc disease is noted at L2-3. Mild anterior osteophyte formation is noted at L3-4. IMPRESSION: Moderate degenerative disc disease is noted at L2-3. No acute abnormality seen in the lumbar spine. Electronically Signed   By: Marijo Conception M.D.   On: 05/16/2020 14:40   US Venous Img Lower Unilateral Right (DVT)  Result Date: 05/16/2020 CLINICAL DATA:  Redness and swelling x3 days, pain. Previous tobacco abuse EXAM: RIGHT LOWER EXTREMITY VENOUS DOPPLER ULTRASOUND TECHNIQUE: Gray-scale sonography with compression, as well as color and duplex ultrasound, were performed to evaluate the deep venous system(s) from the level of the common femoral vein through the popliteal and proximal calf veins. COMPARISON:  None. FINDINGS: VENOUS Normal compressibility of the common femoral, superficial femoral, and popliteal veins, as well as the visualized calf veins. Visualized portions of profunda femoral vein and great saphenous vein unremarkable. No filling defects to suggest DVT on grayscale or color Doppler imaging. Doppler waveforms show normal direction of venous flow, normal respiratory phasicity and response to augmentation. Limited views of the contralateral common femoral vein are unremarkable. OTHER Subcutaneous edema in the calf. Limitations: none IMPRESSION: No femoropopliteal DVT nor evidence of DVT within the visualized calf veins. If clinical symptoms are  inconsistent or if  there are persistent or worsening symptoms, further imaging (possibly involving the iliac veins) may be warranted. Electronically Signed   By: Lucrezia Europe M.D.   On: 05/16/2020 15:20    Procedures Procedures (including critical care time)  Medications Ordered in ED Medications  HYDROcodone-acetaminophen (NORCO/VICODIN) 5-325 MG per tablet 1 tablet (1 tablet Oral Given 05/16/20 1432)  sulfamethoxazole-trimethoprim (BACTRIM DS) 800-160 MG per tablet 1 tablet (1 tablet Oral Given 05/16/20 1601)  Tdap (BOOSTRIX) injection 0.5 mL (0.5 mLs Intramuscular Given 05/16/20 1601)    ED Course  I have reviewed the triage vital signs and the nursing notes.  Pertinent labs & imaging results that were available during my care of the patient were reviewed by me and considered in my medical decision making (see chart for details).    MDM Rules/Calculators/A&P                          Patient with history and exam findings suggesting classic right-sided sciatica pain.  She is currently taking ibuprofen and Flexeril with some improvement in symptoms, also applying ice packs.  Discussed other home modalities including activity as tolerated, heat therapy, stretching and positional changes to take the pressure off her lower spine.  She has no neurologic deficits of concern, specifically no evidence of cauda equina.  She does have a localized right lower anterior cellulitis.  Her ultrasound is negative for DVT in this extremity.  She was placed on Bactrim.  Strict return precautions and/or follow-up with her PCP were also discussed.  Patient reports her initial inciting event for the cellulitis was a scratch to her leg.  She is not current on her tetanus which was updated today.   Final Clinical Impression(s) / ED Diagnoses Final diagnoses:  Sciatica of right side  Cellulitis of right lower extremity    Rx / DC Orders ED Discharge Orders         Ordered    sulfamethoxazole-trimethoprim (BACTRIM  DS) 800-160 MG tablet  2 times daily     Discontinue  Reprint     05/16/20 1538    HYDROcodone-acetaminophen (NORCO/VICODIN) 5-325 MG tablet  Every 4 hours PRN,   Status:  Discontinued     Reprint     05/16/20 1538    HYDROcodone-acetaminophen (NORCO/VICODIN) 5-325 MG tablet  Every 4 hours PRN     Discontinue  Reprint     05/16/20 1542           Evalee Jefferson, PA-C 05/16/20 1609    Davonna Belling, MD 05/16/20 1622

## 2020-05-16 NOTE — ED Triage Notes (Signed)
Pt with lower back after bending over on Sunday, pt states pain radiates down right leg.  Denies hx of this pain.

## 2020-05-18 ENCOUNTER — Ambulatory Visit: Payer: Medicare HMO | Admitting: Cardiovascular Disease

## 2020-05-29 ENCOUNTER — Encounter: Payer: Self-pay | Admitting: *Deleted

## 2020-05-29 ENCOUNTER — Ambulatory Visit
Admission: RE | Admit: 2020-05-29 | Discharge: 2020-05-29 | Disposition: A | Discharge status: Home / Self Care | Payer: Medicare HMO | Source: Ambulatory Visit | Attending: Internal Medicine | Admitting: Internal Medicine

## 2020-05-29 ENCOUNTER — Other Ambulatory Visit: Payer: Self-pay | Admitting: Internal Medicine

## 2020-05-29 ENCOUNTER — Other Ambulatory Visit: Payer: Self-pay

## 2020-05-29 DIAGNOSIS — N6310 Unspecified lump in the right breast, unspecified quadrant: Secondary | ICD-10-CM

## 2020-05-30 ENCOUNTER — Telehealth: Payer: Self-pay | Admitting: Cardiology

## 2020-05-30 ENCOUNTER — Ambulatory Visit (INDEPENDENT_AMBULATORY_CARE_PROVIDER_SITE_OTHER): Payer: Medicare HMO | Admitting: Cardiology

## 2020-05-30 ENCOUNTER — Encounter: Payer: Self-pay | Admitting: Cardiology

## 2020-05-30 VITALS — BP 124/68 | HR 86 | Ht 65.0 in | Wt 307.0 lb

## 2020-05-30 DIAGNOSIS — R0602 Shortness of breath: Secondary | ICD-10-CM | POA: Diagnosis not present

## 2020-05-30 DIAGNOSIS — E782 Mixed hyperlipidemia: Secondary | ICD-10-CM | POA: Diagnosis not present

## 2020-05-30 DIAGNOSIS — E1165 Type 2 diabetes mellitus with hyperglycemia: Secondary | ICD-10-CM

## 2020-05-30 DIAGNOSIS — J449 Chronic obstructive pulmonary disease, unspecified: Secondary | ICD-10-CM

## 2020-05-30 DIAGNOSIS — I700 Atherosclerosis of aorta: Secondary | ICD-10-CM

## 2020-05-30 DIAGNOSIS — M7989 Other specified soft tissue disorders: Secondary | ICD-10-CM | POA: Diagnosis not present

## 2020-05-30 NOTE — Patient Instructions (Addendum)
Medication Instructions:   Your physician recommends that you continue on your current medications as directed. Please refer to the Current Medication list given to you today.  Labwork:  NONE  Testing/Procedures: Your physician has requested that you have a right lower extremity venous duplex. This test is an ultrasound of the veins in the legs. It looks at venous blood flow that carries blood from the heart to the legs or arms. Allow one hour for a Lower Venous exam. There are no restrictions or special instructions. Your physician has requested that you have an echocardiogram. Echocardiography is a painless test that uses sound waves to create images of your heart. It provides your doctor with information about the size and shape of your heart and how well your heart's chambers and valves are working. This procedure takes approximately one hour. There are no restrictions for this procedure.   Follow-Up:  Your physician recommends that you schedule a follow-up appointment in: pending.  Any Other Special Instructions Will Be Listed Below (If Applicable).  If you need a refill on your cardiac medications before your next appointment, please call your pharmacy.

## 2020-05-30 NOTE — Telephone Encounter (Signed)
Pre-cert Verification for the following procedure    US VENOUS IMAG BI/LT/RT & ECHO   DATE:   06/05/2020  LOCATION  :[facility]

## 2020-05-30 NOTE — Progress Notes (Signed)
Cardiology Office Note  Date: 05/30/2020   ID: Jenna Wood, DOB [birth year], MRN [id number]  PCP:  Andree Moro, DO  Cardiologist:  Rozann Lesches, MD Electrophysiologist:  None   Chief Complaint  Patient presents with  . Leg Swelling    History of Present Illness: Jenna Wood is a 59 y.o. female referred for cardiology consultation by Dr. Posey Pronto at [facility] in [place].  She states that she was sent for a "routine check."  Provided documentation does not elucidate any specific concerns.  She does tell me that she has been experiencing intermittent leg swelling over the years, somewhat worse in the last month, right greater than left.  She also has COPD with pending reevaluation by Dr. Melvyn Novas.  She reports recurring wheezing with intermittent cough and chest congestion, states that she has been using her inhalers regularly.  No fevers or chills.  She reports no history of known cardiac disease, no obvious angina symptoms, no palpitations or syncope.  I personally reviewed her ECG today which shows normal sinus rhythm with low voltage.  Prior evaluation of LVEF documented in the chart.  She did have a chest x-ray back in 2019 which demonstrated bronchitic changes, also incidentally noted aortic atherosclerosis.  She has been on Lipitor and WelChol for treatment of hyperlipidemia, LDL from June 2020 was 68.  Her hemoglobin A1c was greater than 14% at that time, but she states that it most recently had come down to 8.6% and she continues to work with her PCP on better glucose control.  Past Medical History:  Diagnosis Date  . Anxiety   . Chronic back pain   . COPD (chronic obstructive pulmonary disease) ([place])   . Depression   . DJD (degenerative joint disease)   . Essential hypertension   . Fibromyalgia   . Gallstones   . GERD (gastroesophageal reflux disease)   . Hyperlipidemia   . Migraine   . Pancreatitis     Past Surgical History:  Procedure Laterality Date    . CHOLECYSTECTOMY    . WISDOM TOOTH EXTRACTION      Current Outpatient Medications  Medication Sig Dispense Refill  . acetaminophen (TYLENOL) 325 MG tablet Take 2 tablets (650 mg total) by mouth every 6 (six) hours as needed for mild pain or moderate pain. 30 tablet 0  . albuterol (PROVENTIL HFA;VENTOLIN HFA) 108 (90 BASE) MCG/ACT inhaler Inhale 2 puffs into the lungs every 4 (four) hours as needed for wheezing or shortness of breath.     Marland Kitchen atorvastatin (LIPITOR) 10 MG tablet Take 10 mg by mouth daily.    . busPIRone (BUSPAR) 15 MG tablet Take 15 mg by mouth 3 (three) times daily.    . cyclobenzaprine (FLEXERIL) 10 MG tablet Take 10 mg by mouth at bedtime.  2  . DULoxetine (CYMBALTA) 60 MG capsule Take 60 mg by mouth 2 (two) times daily.    . furosemide (LASIX) 40 MG tablet Take 40 mg by mouth daily.    Marland Kitchen LYRICA 150 MG capsule Take 150 mg by mouth 2 (two) times daily.  2  . omeprazole (PRILOSEC) 20 MG capsule Take 30- 60 min before your first and last meals of the day (Patient taking differently: daily. Take 30- 60 min before your first and last meals of the day) 60 capsule 11  . pentoxifylline (TRENTAL) 400 MG CR tablet Take 400 mg by mouth 3 (three) times daily.  3  . potassium chloride SA (K-DUR,KLOR-CON) 20 MEQ tablet Take  20 mEq by mouth daily.    Marland Kitchen telmisartan (MICARDIS) 40 MG tablet Take 1 tablet (40 mg total) by mouth daily. 30 tablet 11  . budesonide-formoterol (SYMBICORT) 160-4.5 MCG/ACT inhaler Inhale 2 puffs into the lungs 2 (two) times daily. 1 Inhaler 0  . colesevelam (WELCHOL) 625 MG tablet Take 1 tablet (625 mg total) by mouth 2 (two) times daily with a meal for 252 doses. Take 3 tablets by mouth twice daily for 6 weeks. 180 tablet 1  . HYDROcodone-acetaminophen (NORCO/VICODIN) 5-325 MG tablet Take 1 tablet by mouth every 4 (four) hours as needed for moderate pain. 15 tablet 0  . ipratropium-albuterol (DUONEB) 0.5-2.5 (3) MG/3ML SOLN 1 vial in nebulizer every 4 hours as needed     . TRAVATAN Z 0.004 % SOLN ophthalmic solution Place 1 drop into both eyes at bedtime.  4   No current facility-administered medications for this visit.   Allergies:  Patient has no known allergies.   Social History: The patient  reports that she has been smoking cigarettes. She has a 40.00 pack-year smoking history. She has never used smokeless tobacco. She reports that she does not drink alcohol and does not use drugs.   Family History: The patient's family history includes Anemia in her mother; Breast cancer in her maternal aunt; Diabetes in her mother; Hypertension in her mother; Lung cancer in her brother and father.   ROS:  No orthopnea or PND.  Physical Exam: VS:  BP 124/68   Pulse 86   Ht 5\' 5"  (1.651 m)   Wt (!) 307 lb (139.3 kg)   SpO2 96%   BMI 51.09 kg/m , BMI Body mass index is 51.09 kg/m.  Wt Readings from Last 3 Encounters:  05/30/20 (!) 307 lb (139.3 kg)  05/16/20 300 lb (136.1 kg)  10/22/18 294 lb (133.4 kg)    General: Morbidly obese woman, appears comfortable at rest. HEENT: Conjunctiva and lids normal, wearing a mask. Neck: Supple, no obvious elevated JVP or carotid bruits, no thyromegaly. Lungs: Decreased breath sounds throughout, nonlabored breathing at rest. Cardiac: Regular rate and rhythm, probable S4, soft systolic murmur, no pericardial rub. Abdomen: Protuberant, bowel sounds present, no guarding or rebound. Extremities: Lower extremity edema, right greater than left, right side tight with erythema and a few skin blisters.  No drainage. Skin: Warm and dry. Musculoskeletal: No kyphosis. Neuropsychiatric: Alert and oriented x3, affect grossly appropriate.  ECG:  No prior tracings available for review today.  Recent Labwork:  June 2020: Cholesterol 141, HDL 42, glycerides 299, LDL 68, BUN 10, creatinine 0.88, potassium 4.3, AST 31, ALT 32, hemoglobin 16.8, platelets 213, TSH 4.07, hemoglobin A1c greater than 14%  Other Studies Reviewed  Today:  Chest x-ray 08/13/2018: FINDINGS: Cardiomediastinal silhouette is normal. Mildly calcified aortic arch. No pleural effusions or focal consolidations. Mild bronchitic changes. RIGHT diaphragmatic hepatic eventration. Trachea projects midline and there is no pneumothorax. Soft tissue planes and included osseous structures are non-suspicious. Surgical clips in the included right abdomen compatible with cholecystectomy.  IMPRESSION: Mild bronchitic changes without focal consolidation.  Aortic Atherosclerosis (ICD10-I70.0).  Assessment and Plan:  1.  Asymmetric lower extremity edema.  Patient states that she has a history of recurring leg swelling and has been on Lasix for quite some time.  We will send her for right lower extremity venous Dopplers to exclude DVT given asymmetry.  Otherwise an echocardiogram will be obtained to assess cardiac structure and function, also exclude pulmonary hypertension given COPD.  2.  Incidentally  noted aortic atherosclerosis by chest x-ray in 2019.  This would suggest aggressive risk factor reduction measures.  Weight loss is indicated.  Agree with lipid-lowering regimen, her last LDL was 68 with goal being under 70.  She also needs better control of her blood sugars, PCP could consider referral to endocrinology if necessary.  3.  Essential hypertension, blood pressure is well controlled today.  She is on Micardis.  4.  COPD.  Plans to reestablish follow-up with Dr. Melvyn Novas this week.  Medication Adjustments/Labs and Tests Ordered: Current medicines are reviewed at length with the patient today.  Concerns regarding medicines are outlined above.   Tests Ordered: Orders Placed This Encounter  Procedures  . US Venous Img Lower Unilateral Right (DVT)  . EKG 12-Lead  . ECHOCARDIOGRAM COMPLETE    Medication Changes: No orders of the defined types were placed in this encounter.   Disposition:  Follow up test results.  Signed, Satira Sark, MD, [facility] 05/30/2020 9:38 AM    [facility], [place], [place] [address] Phone: [phone]; Fax: [phone]

## 2020-05-31 ENCOUNTER — Ambulatory Visit: Payer: Medicare HMO | Admitting: Internal Medicine

## 2020-06-01 ENCOUNTER — Other Ambulatory Visit (HOSPITAL_COMMUNITY): Payer: Medicare HMO

## 2020-06-04 ENCOUNTER — Ambulatory Visit (HOSPITAL_COMMUNITY): Payer: Medicare HMO

## 2020-06-05 ENCOUNTER — Ambulatory Visit (HOSPITAL_COMMUNITY)
Admission: RE | Admit: 2020-06-05 | Discharge: 2020-06-05 | Disposition: A | Discharge status: Home / Self Care | Payer: Medicare HMO | Source: Ambulatory Visit | Attending: Cardiology | Admitting: Cardiology

## 2020-06-05 ENCOUNTER — Other Ambulatory Visit: Payer: Self-pay

## 2020-06-05 ENCOUNTER — Ambulatory Visit (HOSPITAL_BASED_OUTPATIENT_CLINIC_OR_DEPARTMENT_OTHER)
Admission: RE | Admit: 2020-06-05 | Discharge: 2020-06-05 | Disposition: A | Discharge status: Home / Self Care | Payer: Medicare HMO | Source: Ambulatory Visit | Attending: Cardiology | Admitting: Cardiology

## 2020-06-05 DIAGNOSIS — I10 Essential (primary) hypertension: Secondary | ICD-10-CM | POA: Diagnosis not present

## 2020-06-05 DIAGNOSIS — M7989 Other specified soft tissue disorders: Secondary | ICD-10-CM | POA: Diagnosis not present

## 2020-06-05 DIAGNOSIS — R0609 Other forms of dyspnea: Secondary | ICD-10-CM | POA: Insufficient documentation

## 2020-06-05 DIAGNOSIS — J449 Chronic obstructive pulmonary disease, unspecified: Secondary | ICD-10-CM | POA: Diagnosis not present

## 2020-06-05 DIAGNOSIS — F17210 Nicotine dependence, cigarettes, uncomplicated: Secondary | ICD-10-CM | POA: Diagnosis not present

## 2020-06-05 DIAGNOSIS — E785 Hyperlipidemia, unspecified: Secondary | ICD-10-CM | POA: Insufficient documentation

## 2020-06-05 DIAGNOSIS — R0602 Shortness of breath: Secondary | ICD-10-CM | POA: Diagnosis not present

## 2020-06-05 LAB — ECHOCARDIOGRAM COMPLETE
Area-P 1/2: 4.08 cm2
S' Lateral: 2.92 cm

## 2020-06-05 NOTE — Progress Notes (Signed)
*  PRELIMINARY RESULTS* Echocardiogram 2D Echocardiogram has been performed.  Samuel Germany 06/05/2020, 12:42 PM

## 2020-06-06 ENCOUNTER — Telehealth: Payer: Self-pay | Admitting: *Deleted

## 2020-06-06 NOTE — Telephone Encounter (Signed)
-----   Message from Satira Sark, MD sent at 06/05/2020  4:42 PM EDT ----- Results reviewed.  LVEF is normal at 55 to 60%.  Also normal RV contraction, not able to estimate pulmonary artery systolic pressure.  Overall reassuring findings and would suggest that her lower extremity edema is most likely noncardiac.

## 2020-06-06 NOTE — Telephone Encounter (Signed)
-----   Message from Satira Sark, MD sent at 06/05/2020  4:43 PM EDT ----- Results reviewed.  Please let her know that the lower extremity venous Doppler did not suggest DVT as cause of her asymmetric edema.  As noted by recent echocardiogram this is most likely noncardiac.  Keep follow-up with PCP for now.

## 2020-06-06 NOTE — Telephone Encounter (Signed)
Patient informed. Copy sent to PCP °

## 2020-06-14 ENCOUNTER — Ambulatory Visit: Payer: Medicare HMO | Admitting: Internal Medicine

## 2020-07-06 ENCOUNTER — Ambulatory Visit: Payer: Medicare HMO | Admitting: Internal Medicine

## 2020-09-27 ENCOUNTER — Ambulatory Visit (HOSPITAL_COMMUNITY)
Admission: RE | Admit: 2020-09-27 | Discharge: 2020-09-27 | Disposition: A | Discharge status: Home / Self Care | Payer: Medicare HMO | Source: Ambulatory Visit | Attending: Internal Medicine | Admitting: Internal Medicine

## 2020-09-27 ENCOUNTER — Ambulatory Visit: Payer: Medicare HMO | Admitting: Internal Medicine

## 2020-09-27 ENCOUNTER — Other Ambulatory Visit: Payer: Self-pay

## 2020-09-27 ENCOUNTER — Encounter: Payer: Self-pay | Admitting: Internal Medicine

## 2020-09-27 DIAGNOSIS — J45991 Cough variant asthma: Secondary | ICD-10-CM | POA: Diagnosis not present

## 2020-09-27 DIAGNOSIS — F17210 Nicotine dependence, cigarettes, uncomplicated: Secondary | ICD-10-CM | POA: Diagnosis not present

## 2020-09-27 MED ORDER — METHYLPREDNISOLONE ACETATE 80 MG/ML IJ SUSP
120.0000 mg | Freq: Once | INTRAMUSCULAR | Status: AC
  Administered 2020-09-27: 120 mg via INTRAMUSCULAR

## 2020-09-27 MED ORDER — STIOLTO RESPIMAT 2.5-2.5 MCG/ACT IN AERS: 2.0000 | INHALATION_SPRAY | Freq: Every day | RESPIRATORY_TRACT | 11 refills | Status: DC

## 2020-09-27 NOTE — Assessment & Plan Note (Signed)
Active smoker Spirometry 08/13/2018  FEV1 2.0 (79%)  Ratio 84 s prior  rx and flat effort dep portion of f/v loop  FENO 09/24/2018  =  69 - 09/24/2018    Try symbicort 80 2bid targeting noct cough > worse 10/22/2018 though not c/w symb  - 10/22/2018  After extensive coaching inhaler device,  effectiveness =    75% > resume symb 160 2 bid  And add 1st gen H1 blockers per guidelines  For noct cough> never took H1 and got thrush on symbicort so stopped it too - 09/27/2020  After extensive coaching inhaler device,  effectiveness =    75% on smi so try stiolto 2 each am / depomedrol 120 mg IM    Still little evidence of copd but since can't use symbicort in effective doses will try lama/laba x 6 weeks and max rx for GERD/ add 1st gen H1 blockers per guidelines  And regroup  with all meds in hand using a trust but verify approach to confirm accurate Medication  Reconciliation The principal here is that until we are certain that the  patients are doing what we've asked, it makes no sense to ask them to do more.

## 2020-09-27 NOTE — Assessment & Plan Note (Addendum)
Body mass index is 52.45 kg/m.  -  trendingup still  No results found for: TSH   Contributing to gerd risk/ doe/reviewed the need and the process to achieve and maintain neg calorie balance > defer f/u primary care including intermittently monitoring thyroid status

## 2020-09-27 NOTE — Progress Notes (Signed)
Jenna Wood, female    DOB: August 05, 1961,    MRN: [id number]    Brief patient profile:  54 yowf active smoker last child born 1982 with baseline wt of 170 with new onset breathing problems 2015 > Pulmonary eval in [place] dx copd rx BREO no better but continued use saba hfa 2-3 x per day neb 1-2 x per month and worse sob x spring 2018 so referred to pulmonary clinic 08/13/2018 by Dr   Alphonzo Grieve  PA Dessie Coma with nl spirometry 08/13/2018 on ACEi     History of Present Illness  08/13/2018  Pulmonary/ 1st office eval/ Kiernan Farkas   Chief Complaint  Patient presents with  . Pulmonary Consult    Referred by Dr. Lillia Corporal. Pt c/o SOB x 18 months. She gets winded walking approx 25 ft. She states she feels like she may have a sinus infection currently- prod cough with yellow sputum. She uses her albuterol inhaler 2-3 x per day and as duoneb that she rarely uses.   Dyspnea:  Progressively worse to point where room / rides cart at grocery store / uses [facility] parking  Cough: x one week congested/ rattly with min ywllow mucus esp in am's assoc with nasal congestion/ slt purulent d/c Sleep: bed flat/ 3 pillows  SABA use:  2-3 x per day rec Plan A = Automatic = stop lisinopril and start micardis 40 mg one each am and change omeparzole to 20 mg Take 30-60 min before first meal of the day  zpak  Plan B = Backup Only use your albuterol (Ventolin) as a rescue medication Plan C = Crisis - only use your albuterol nebulizer if you first try Plan B and it fails to help > ok to use the nebulizer up to every 4 hours but if start needing it regularly call for immediate appointment      09/24/2018  f/u ov/Casin Federici re:  Cough variant asthma vs uacs/ still smoking  Chief Complaint  Patient presents with  . Follow-up    Breathing has improved some. She has not had to use her albuterol. Cough has also improved.   Dyspnea:  Rides the scooter due to back Cough: immediately every night > clear mucus, most night and in am   Sleeping: flat bed / sev pillows/ no bed blocks  SABA use: not using at all  02: none rec Plan A = Automatic = Symbicort 80 Take 2 puffs first thing in am and then another 2 puffs about 12 hours later.  Work on inhaler technique:   Plan B = Backup Only use your albuterol inhaler as a rescue medication Plan C = Crisis - only use your albuterol/ipatropium nebulizer if you first try Plan B and it fails to help > ok to use the nebulizer up to every 4 hours but if start needing it regularly call for immediate appointment If not improving with night time cough after 3-4 days, go ahead and take augmentin   875 mg take one pill twice daily  X 10 days - take at breakfast and supper with large glass of water.  It would help reduce the usual side effects (diarrhea and yeast infections) if you ate cultured yogurt at lunch.   Please schedule a follow up office visit in 4 weeks, sooner if needed       10/22/2018  f/u ov/Mattelyn Imhoff re:  uacs vs asthma / [place] - worse off symb 80 2bid  Chief Complaint  Patient presents with  . Follow-up  Breathing is about the same. She has been coughing more and wheezing- prod with clear sputum. She is using her rescue inhaler 2 x daily on average. She has not used her neb.  Dyspnea:  Back stops from walking before breathing  Cough: clear mucus as soon as lie down  Assoc with subjective wheeze  Sleeping: sleeps on couch 30 degrees x 9 m SABA use: increased since stopped symb 02: no  rec For drainage / throat tickle try take CHLORPHENIRAMINE  4 mg (Chlor tab ) - take one every 4 hours as needed -    Try increase symbicort to 160 Take 2 puffs first thing in am and then another 2 puffs about 12 hours later.  Work on inhaler technique:    The key is to stop smoking completely before smoking completely stops you!    09/27/2020  f/u ov/[place] office/Jeyren Danowski re: re establish for  cough variant asthma vs uacs/ still smoking / Chief Complaint  Patient presents with  .  Consult    shortness of breath with exertion   Dyspnea:  Rides scooter at grocery/  Walking to mb about 50 ft and stops half way and then recovers and walks up  5 step landing  Cough: more so when supine > clear mucus x 3 months   Sleeping: bed is flat / wedge pillow 45 degrees  SABA use: once or twice a day  02: none    No obvious day to day or daytime variability or assoc excess/ purulent sputum or mucus plugs or hemoptysis or cp or chest tightness, subjective wheeze or overt sinus or hb symptoms.     Also denies any obvious fluctuation of symptoms with weather or environmental changes or other aggravating or alleviating factors except as outlined above   No unusual exposure hx or h/o childhood pna/ asthma or knowledge of premature birth.  Current Allergies, Complete Past Medical History, Past Surgical History, Family History, and Social History were reviewed in [employer] record.  ROS  The following are not active complaints unless bolded Hoarseness, sore throat, dysphagia, dental problems, itching, sneezing,  nasal congestion or discharge of excess mucus or purulent secretions, ear ache,   fever, chills, sweats, unintended wt loss or wt gain, classically pleuritic or exertional cp,  orthopnea pnd or arm/hand swelling  or leg swelling, presyncope, palpitations, abdominal pain, anorexia, nausea, vomiting, diarrhea  or change in bowel habits or change in bladder habits, change in stools or change in urine, dysuria, hematuria,  rash, arthralgias, visual complaints, headache, numbness, weakness or ataxia or problems with walking or coordination,  change in mood or  memory.        Current Meds  Medication Sig  . acetaminophen (TYLENOL) 325 MG tablet Take 2 tablets (650 mg total) by mouth every 6 (six) hours as needed for mild pain or moderate pain.  Marland Kitchen albuterol (PROVENTIL HFA;VENTOLIN HFA) 108 (90 BASE) MCG/ACT inhaler Inhale 2 puffs into the lungs every 4 (four) hours  as needed for wheezing or shortness of breath.   Marland Kitchen atorvastatin (LIPITOR) 10 MG tablet Take 10 mg by mouth daily.  . brexpiprazole (REXULTI) 2 MG TABS tablet Take 2 mg by mouth daily.  . busPIRone (BUSPAR) 15 MG tablet Take 15 mg by mouth 3 (three) times daily.  . cyclobenzaprine (FLEXERIL) 10 MG tablet Take 10 mg by mouth at bedtime.  . DULoxetine (CYMBALTA) 60 MG capsule Take 60 mg by mouth 2 (two) times daily.  Marland Kitchen ezetimibe (ZETIA)  10 MG tablet Take 10 mg by mouth daily.  . furosemide (LASIX) 40 MG tablet Take 40 mg by mouth daily.  Marland Kitchen LYRICA 150 MG capsule Take 150 mg by mouth 2 (two) times daily.  Marland Kitchen omeprazole (PRILOSEC) 20 MG capsule Take 30- 60 min before your first and last meals of the day (Patient taking differently: daily. Take 30- 60 min before your first and last meals of the day)  . pentoxifylline (TRENTAL) 400 MG CR tablet Take 400 mg by mouth 3 (three) times daily.  . potassium chloride SA (K-DUR,KLOR-CON) 20 MEQ tablet Take 20 mEq by mouth daily.  Marland Kitchen telmisartan (MICARDIS) 40 MG tablet Take 1 tablet (40 mg total) by mouth daily.  Marland Kitchen vortioxetine HBr (TRINTELLIX) 20 MG TABS tablet Take 20 mg by mouth daily.                        Objective:        09/27/2020     315  10/22/2018    294   09/24/18 286 lb 3.2 oz (129.8 kg)  09/23/18 278 lb (126.1 kg)  09/03/18 278 lb 9.6 oz (126.4 kg)    Vital signs reviewed  09/27/2020  - Note at rest 02 sats  94% on RA        Obese pleasant amb wf nad   HEENT : pt wearing mask not removed for exam due to covid -19 concerns.    NECK :  without JVD/Nodes/TM/ nl carotid upstrokes bilaterally   LUNGS: no acc muscle use,  Nl contour chest with minimal insp/exp rhonchi  bilaterally without cough on insp or exp maneuvers   CV:  RRR  no s3 or murmur or increase in P2, and no edema   ABD: tensely obese/   nontender with nl inspiratory excursion in the supine position. No bruits or organomegaly appreciated, bowel sounds nl  MS:   Nl gait/ ext warm without deformities, calf tenderness, cyanosis or clubbing No obvious joint restrictions   SKIN: warm and dry without lesions    NEURO:  alert, approp, nl sensorium with  no motor or cerebellar deficits apparent.      CXR PA and Lateral:   09/27/2020 :    I personally reviewed images and  impression as follows:   Mild increase markings, mild kyphosis, no hyperinflation       Assessment

## 2020-09-27 NOTE — Assessment & Plan Note (Signed)
Counseled re importance of smoking cessation but did not meet time criteria for separate billing           Each maintenance medication was reviewed in detail including emphasizing most importantly the difference between maintenance and prns and under what circumstances the prns are to be triggered using an action plan format where appropriate.  Total time for H and P, chart review, counseling, teaching device and generating customized AVS unique to this office visit / charting = 30 min       

## 2020-09-27 NOTE — Patient Instructions (Addendum)
For drainage / throat tickle try take CHLORPHENIRAMINE  4 mg  (Chlortab 4mg   at [employer] should be easiest to find in the green box)  take one every 4 hours as needed - available over the counter- may cause drowsiness so start with just a bedtime dose or two and see how you tolerate it before trying in daytime     Omeprazole should be Take 30- 60 min before your first and last meals of the day   GERD (REFLUX)  is an extremely common cause of respiratory symptoms just like yours , many times with no obvious heartburn at all.    It can be treated with medication, but also with lifestyle changes including elevation of the head of your bed (ideally with 6 -8inch blocks under the headboard of your bed),  Smoking cessation, avoidance of late meals, excessive alcohol, and avoid fatty foods, chocolate, peppermint, colas, red wine, and acidic juices such as orange juice.  NO MINT OR MENTHOL PRODUCTS SO NO COUGH DROPS  USE SUGARLESS CANDY INSTEAD (Jolley ranchers or Stover's or Life Savers) or even ice chips will also do - the key is to swallow to prevent all throat clearing. NO OIL BASED VITAMINS - use powdered substitutes.  Avoid fish oil when coughing.  depomedrol 120 mg IM today    Plan A = Automatic = Always=    stiolto 2 puffs each am   Work on inhaler technique:  relax and gently blow all the way out then take a nice smooth deep breath back in, triggering the inhaler at same time you start breathing in.  Hold for up to 5 seconds if you can.  . Rinse and gargle with water when done      Plan B = Backup (to supplement plan A, not to replace it) Only use your albuterol inhaler as a rescue medication to be used if you can't catch your breath by resting or doing a relaxed purse lip breathing pattern.  - The less you use it, the better it will work when you need it. - Ok to use the inhaler up to 2 puffs  every 4 hours if you must but call for appointment if use goes up over your usual need -  Don't leave home without it !!  (think of it like the spare tire for your car)    Please remember to go to the  x-ray department  @  [facility] for your tests - we will call you with the results when they are available      Please schedule a follow up office visit in 6 weeks, call sooner if needed - bring inhalers with you

## 2020-11-06 ENCOUNTER — Ambulatory Visit: Payer: Medicare HMO | Admitting: Internal Medicine

## 2020-11-30 ENCOUNTER — Other Ambulatory Visit: Payer: Medicare HMO

## 2021-02-28 ENCOUNTER — Other Ambulatory Visit: Payer: Self-pay | Admitting: Family Medicine

## 2021-02-28 ENCOUNTER — Other Ambulatory Visit: Payer: Self-pay

## 2021-02-28 ENCOUNTER — Ambulatory Visit
Admission: RE | Admit: 2021-02-28 | Discharge: 2021-02-28 | Disposition: A | Discharge status: Home / Self Care | Payer: Medicare HMO | Source: Ambulatory Visit | Attending: Family Medicine | Admitting: Family Medicine

## 2021-02-28 DIAGNOSIS — J449 Chronic obstructive pulmonary disease, unspecified: Secondary | ICD-10-CM

## 2021-03-06 ENCOUNTER — Telehealth: Payer: Self-pay | Admitting: Internal Medicine

## 2021-03-06 NOTE — Telephone Encounter (Signed)
Spoke with Juliann Pulse at [facility]  She states that they noticed this morning that they have been accidentally dispensing Spiriva respimat instead of Stiolto  This has been going on since Nov 2021 They have now given her the correct inhaler- Stiolto  Pt did not report having any issues while on Spiriva, and never noticed the difference  I called and spoke with the pt, and she reports doing well and that she now has correct med  I noticed she was overdue f/u, so I made her an appt for [place] 03/13/21 Will forward to MW as Juluis Rainier

## 2021-03-13 ENCOUNTER — Encounter: Payer: Self-pay | Admitting: Internal Medicine

## 2021-03-13 ENCOUNTER — Ambulatory Visit: Payer: Medicare HMO | Admitting: Internal Medicine

## 2021-03-13 ENCOUNTER — Telehealth: Payer: Self-pay | Admitting: Internal Medicine

## 2021-03-13 ENCOUNTER — Other Ambulatory Visit: Payer: Self-pay

## 2021-03-13 DIAGNOSIS — J45991 Cough variant asthma: Secondary | ICD-10-CM | POA: Diagnosis not present

## 2021-03-13 DIAGNOSIS — F17210 Nicotine dependence, cigarettes, uncomplicated: Secondary | ICD-10-CM

## 2021-03-13 DIAGNOSIS — J449 Chronic obstructive pulmonary disease, unspecified: Secondary | ICD-10-CM

## 2021-03-13 MED ORDER — METHYLPREDNISOLONE ACETATE 80 MG/ML IJ SUSP
120.0000 mg | Freq: Once | INTRAMUSCULAR | Status: AC
Start: 2021-03-13 — End: 2021-03-13
  Administered 2021-03-13: 120 mg via INTRAMUSCULAR

## 2021-03-13 NOTE — Assessment & Plan Note (Signed)
Active smoker Spirometry 08/13/2018  FEV1 2.0 (79%)  Ratio 84 s prior  rx and flat effort dep portion of f/v loop  FENO 09/24/2018  =  69 - 09/24/2018    Try symbicort 80 2bid targeting noct cough > worse 10/22/2018 though not c/w symb  - 10/22/2018  After extensive coaching inhaler device,  effectiveness =    75% > resume symb 160 2 bid  And add 1st gen H1 blockers per guidelines  For noct cough> never took H1 and got thrush on symbicort so stopped it too - 09/27/2020   try stiolto 2 each am / depomedrol 120 mg IM   - 03/13/2021  After extensive coaching inhaler device,  effectiveness =    80%    Ab component worse off laba > continue stiolto plus  depomedrol 120 mg IM and max rx for gerd

## 2021-03-13 NOTE — Telephone Encounter (Signed)
error 

## 2021-03-13 NOTE — Assessment & Plan Note (Signed)
Body mass index is 52.42 kg/m.  -  No change from priors No results found for: TSH   Contributing to gerd risk/ doe/reviewed the need and the process to achieve and maintain neg calorie balance > defer f/u primary care including intermittently monitoring thyroid status

## 2021-03-13 NOTE — Assessment & Plan Note (Addendum)
Counseled re importance of smoking cessation but did not meet time criteria for separate billing    Low-dose CT lung cancer screening is recommended for patients who are 10-59 years of age with a 30+ pack-year history of smoking, and who are currently smoking or quit <=15 years ago >>> referred for early decision making          Each maintenance medication was reviewed in detail including emphasizing most importantly the difference between maintenance and prns and under what circumstances the prns are to be triggered using an action plan format where appropriate.  Total time for H and P, chart review, counseling, reviewing smi device(s) and generating customized AVS unique to this office visit / same day charting =31 min

## 2021-03-13 NOTE — Assessment & Plan Note (Addendum)
Active smoker Spirometry 08/13/2018  FEV1 2.0 (79%)  Ratio 84 s prior  rx and flat effort dep portion of f/v loop  - 08/13/2018  Walked RA x 3 laps @ 185 ft each stopped due to end of study, fast pace, no desat, min sob  - 03/13/2021  After extensive coaching inhaler device,  effectiveness = 80% with smi   She would likely be more symptomatic if not limited by wt/ back pain with AB component but did not respond to symbicort and doing better with stiolto so no change in maint rx

## 2021-03-13 NOTE — Addendum Note (Signed)
Addended by: Rosana Berger on: 03/13/2021 04:40 PM   Modules accepted: Orders

## 2021-03-13 NOTE — Progress Notes (Signed)
Jenna Wood, female    DOB: 09/05/1961   MRN: [id number]    Brief patient profile:  86 yowf active smoker last child born 1981 with baseline wt of 170 with new onset breathing problems 2015 > Pulmonary [place] copd rx BREO no better but continued use saba hfa 2-3 x per day neb 1-2 x per month and worse sob x spring 2018 so referred to pulmonary clinic 08/13/2018 by Dr   Alphonzo Grieve  PA Dessie Coma with nl spirometry 08/13/2018 on ACEi     History of Present Illness  08/13/2018  Pulmonary/ 1st office eval/ Eldin Bonsell   Chief Complaint  Patient presents with  . Pulmonary Consult    Referred by Dr. Lillia Corporal. Pt c/o SOB x 18 months. She gets winded walking approx 25 ft. She states she feels like she may have a sinus infection currently- prod cough with yellow sputum. She uses her albuterol inhaler 2-3 x per day and as duoneb that she rarely uses.   Dyspnea:  Progressively worse to point where room / rides cart at grocery store / uses [facility] parking  Cough: x one week congested/ rattly with min ywllow mucus esp in am's assoc with nasal congestion/ slt purulent d/c Sleep: bed flat/ 3 pillows  SABA use:  2-3 x per day rec Plan A = Automatic = stop lisinopril and start micardis 40 mg one each am and change omeparzole to 20 mg Take 30-60 min before first meal of the day  zpak  Plan B = Backup Only use your albuterol (Ventolin) as a rescue medication Plan C = Crisis - only use your albuterol nebulizer if you first try Plan B and it fails to help > ok to use the nebulizer up to every 4 hours but if start needing it regularly call for immediate appointment      09/24/2018  f/u ov/Shauntee Karp re:  Cough variant asthma vs uacs/ still smoking  Chief Complaint  Patient presents with  . Follow-up    Breathing has improved some. She has not had to use her albuterol. Cough has also improved.   Dyspnea:  Rides the scooter due to back Cough: immediately every night > clear mucus, most night and in am   Sleeping: flat bed / sev pillows/ no bed blocks  SABA use: not using at all  02: none rec Plan A = Automatic = Symbicort 80 Take 2 puffs first thing in am and then another 2 puffs about 12 hours later.  Work on inhaler technique:   Plan B = Backup Only use your albuterol inhaler as a rescue medication Plan C = Crisis - only use your albuterol/ipatropium nebulizer if you first try Plan B and it fails to help > ok to use the nebulizer up to every 4 hours but if start needing it regularly call for immediate appointment If not improving with night time cough after 3-4 days, go ahead and take augmentin   875 mg take one pill twice daily  X 10 days - take at breakfast and supper with large glass of water.  It would help reduce the usual side effects (diarrhea and yeast infections) if you ate cultured yogurt at lunch.   Please schedule a follow up office visit in 4 weeks, sooner if needed       10/22/2018  f/u ov/Cleaven Demario re:  uacs vs asthma / [place] - worse off symb 80 2bid  Chief Complaint  Patient presents with  . Follow-up  Breathing is about the same. She has been coughing more and wheezing- prod with clear sputum. She is using her rescue inhaler 2 x daily on average. She has not used her neb.  Dyspnea:  Back stops from walking before breathing  Cough: clear mucus as soon as lie down  Assoc with subjective wheeze  Sleeping: sleeps on couch 30 degrees x 9 m SABA use: increased since stopped symb 02: no  rec For drainage / throat tickle try take CHLORPHENIRAMINE  4 mg (Chlor tab ) - take one every 4 hours as needed -    Try increase symbicort to 160 Take 2 puffs first thing in am and then another 2 puffs about 12 hours later.  Work on inhaler technique:    The key is to stop smoking completely before smoking completely stops you!    09/27/2020  f/u ov/[place] office/Imogean Ciampa re: re establish for  cough variant asthma vs uacs/ still smoking / Chief Complaint  Patient presents with  .  Consult    shortness of breath with exertion   Dyspnea:  Rides scooter at grocery/  Walking to mb about 50 ft and stops half way and then recovers and walks up  5 step landing  Cough: more so when supine > clear mucus x 3 months   Sleeping: bed is flat / wedge pillow 45 degrees  SABA use: once or twice a day  02: none  rec For drainage / throat tickle try take CHLORPHENIRAMINE  4 mg  (Chlortab 4mg   at [employer] should be easiest to find in the green box)  take one every 4 hours as needed - available over the counter- may cause drowsiness so start with just a bedtime dose or two and see how you tolerate it before trying in daytime   Omeprazole should be Take 30- 60 min before your first and last meals of the day  GERD  depomedrol 120 mg IM today   Plan A = Automatic = Always=    stiolto 2 puffs each am  Work on inhaler technique:     Plan B = Backup (to supplement plan A, not to replace it) Only use your albuterol inhaler as a rescue medication Please schedule a follow up office visit in 6 weeks, call sooner if needed - bring inhalers with you       03/13/2021  f/u ov/[place] office/Jurrell Royster re: copd GOLD 0/ AB  Plus [place] on stiolto Chief Complaint  Patient presents with  . Follow-up    Pt states has had bronchitis for the past month- took zpack and pred 3 wks ago without any relief. She states having increased SOB, wheezing, cough with white sputum. She is using her albuterol inhaler 2 x per day on average.   Dyspnea: MMRC3 = can't walk 100 yards even at a slow pace at a flat grade s stopping due to sob   Cough: white mucus esp hs and in am worse on just spiriva, some better on stiolto  Sleeping: 45 degrees x years  SABA use: as above  02: none  Covid status: x 3  Lung cancer screening: recommended    No obvious day to day or daytime variability or assoc excess/ purulent sputum or mucus plugs or hemoptysis or cp or chest tightness, subjective wheeze or overt sinus or hb  symptoms.   96 without nocturnal  or early am exacerbation  of respiratory  c/o's or need for noct saba. Also denies any obvious fluctuation of  symptoms with weather or environmental changes or other aggravating or alleviating factors except as outlined above   No unusual exposure hx or h/o childhood pna/ asthma or knowledge of premature birth.  Current Allergies, Complete Past Medical History, Past Surgical History, Family History, and Social History were reviewed in [employer] record.  ROS  The following are not active complaints unless bolded Hoarseness, sore throat, dysphagia, dental problems, itching, sneezing,  nasal congestion or discharge of excess mucus or purulent secretions, ear ache,   fever, chills, sweats, unintended wt loss or wt gain, classically pleuritic or exertional cp,  orthopnea pnd or arm/hand swelling  or leg swelling, presyncope, palpitations, abdominal pain, anorexia, nausea, vomiting, diarrhea  or change in bowel habits or change in bladder habits, change in stools or change in urine, dysuria, hematuria,  rash, arthralgias, visual complaints, headache, numbness, weakness or ataxia or problems with walking/walks with cane or coordination,  change in mood or  memory.        Current Meds  Medication Sig  . acetaminophen (TYLENOL) 325 MG tablet Take 2 tablets (650 mg total) by mouth every 6 (six) hours as needed for mild pain or moderate pain.  Marland Kitchen albuterol (PROVENTIL HFA;VENTOLIN HFA) 108 (90 BASE) MCG/ACT inhaler Inhale 2 puffs into the lungs every 4 (four) hours as needed for wheezing or shortness of breath.   Marland Kitchen atorvastatin (LIPITOR) 10 MG tablet Take 10 mg by mouth daily.  . brexpiprazole (REXULTI) 2 MG TABS tablet Take 2 mg by mouth daily.  . busPIRone (BUSPAR) 15 MG tablet Take 15 mg by mouth 3 (three) times daily.  . cyclobenzaprine (FLEXERIL) 10 MG tablet Take 10 mg by mouth at bedtime.  . DULoxetine (CYMBALTA) 60 MG capsule Take 60 mg by  mouth 2 (two) times daily.  Marland Kitchen ezetimibe (ZETIA) 10 MG tablet Take 10 mg by mouth daily.  . furosemide (LASIX) 40 MG tablet Take 40 mg by mouth daily.  Marland Kitchen LYRICA 150 MG capsule Take 150 mg by mouth 2 (two) times daily.  Marland Kitchen omeprazole (PRILOSEC) 20 MG capsule Take 30- 60 min before your first and last meals of the day (Patient taking differently: daily. Take 30- 60 min before your first and last meals of the day)  . pentoxifylline (TRENTAL) 400 MG CR tablet Take 400 mg by mouth 3 (three) times daily.  . potassium chloride SA (K-DUR,KLOR-CON) 20 MEQ tablet Take 20 mEq by mouth daily.  Marland Kitchen telmisartan (MICARDIS) 40 MG tablet Take 1 tablet (40 mg total) by mouth daily.  . Tiotropium Bromide-Olodaterol (STIOLTO RESPIMAT) 2.5-2.5 MCG/ACT AERS Inhale 2 puffs into the lungs daily.  Marland Kitchen vortioxetine HBr (TRINTELLIX) 20 MG TABS tablet Take 20 mg by mouth daily.                               Objective:       03/13/2021        315  09/27/2020     315  10/22/2018    294   09/24/18 286 lb 3.2 oz (129.8 kg)  09/23/18 278 lb (126.1 kg)  09/03/18 278 lb 9.6 oz (126.4 kg)    Vital signs reviewed  03/13/2021  - Note at rest 02 sats  96% on RA   General appearance:   Obese wf nad    HEENT : pt wearing mask not removed for exam due to covid -19 concerns.    NECK :  without  JVD/Nodes/TM/ nl carotid upstrokes bilaterally   LUNGS: no acc muscle use,  Nl contour chest with inspiratory  pops, squeaks and exp rhonchi bilaterally without cough on insp or exp maneuvers   CV:  RRR  no s3 or murmur or increase in P2, and no edema   ABD:  Obese soft and nontender with nl inspiratory excursion in the supine position. No bruits or organomegaly appreciated, bowel sounds nl  MS:  Nl gait/ ext warm without deformities, calf tenderness, cyanosis or clubbing No obvious joint restrictions   SKIN: warm and dry without lesions    NEURO:  alert, approp, nl sensorium with  no motor or cerebellar deficits apparent.           I personally reviewed images and agree with radiology impression as follows:  CXR:   02/28/21  No active cardiopulmonary disease.    Assessment

## 2021-03-13 NOTE — Patient Instructions (Signed)
Continue stiolto 2 puffs each am   Only use your albuterol as a rescue medication to be used if you can't catch your breath by resting or doing a relaxed purse lip breathing pattern.  - The less you use it, the better it will work when you need it. - Ok to use up to 2 puffs  every 4 hours if you must but call for immediate appointment if use goes up over your usual need - Don't leave home without it !!  (think of it like the spare tire for your car)   Depomedrol 120 mg IM    We will be referring to Jenna Form NP  For shared decision making for lung cancer screening.  The key is to stop smoking completely before smoking completely stops you!   Please schedule a follow up visit in 6 months but call sooner if needed

## 2021-03-19 ENCOUNTER — Other Ambulatory Visit: Payer: Self-pay | Admitting: *Deleted

## 2021-03-19 DIAGNOSIS — Z87891 Personal history of nicotine dependence: Secondary | ICD-10-CM

## 2021-03-19 DIAGNOSIS — F17210 Nicotine dependence, cigarettes, uncomplicated: Secondary | ICD-10-CM

## 2021-05-01 ENCOUNTER — Ambulatory Visit (HOSPITAL_COMMUNITY)
Admission: RE | Admit: 2021-05-01 | Discharge: 2021-05-01 | Disposition: A | Discharge status: Home / Self Care | Payer: Medicare HMO | Source: Ambulatory Visit | Attending: Acute Care | Admitting: Acute Care

## 2021-05-01 ENCOUNTER — Encounter: Payer: Self-pay | Admitting: Acute Care

## 2021-05-01 ENCOUNTER — Ambulatory Visit (INDEPENDENT_AMBULATORY_CARE_PROVIDER_SITE_OTHER): Payer: Medicare HMO | Admitting: Acute Care

## 2021-05-01 ENCOUNTER — Telehealth: Payer: Self-pay | Admitting: *Deleted

## 2021-05-01 ENCOUNTER — Other Ambulatory Visit: Payer: Self-pay

## 2021-05-01 DIAGNOSIS — Z87891 Personal history of nicotine dependence: Secondary | ICD-10-CM

## 2021-05-01 DIAGNOSIS — F17210 Nicotine dependence, cigarettes, uncomplicated: Secondary | ICD-10-CM

## 2021-05-01 NOTE — Telephone Encounter (Signed)
Called and spoke with patient, she has a CT scheduled for 2 pm today.  Verified her address for mailing her AVS.  Jenna Wood to call patient from her office, patient aware.  Nothing further needed.

## 2021-05-01 NOTE — Progress Notes (Signed)
Virtual Visit via Telephone Note  I connected with Jenna Wood on 05/01/21 at 11:30 AM EDT by telephone and verified that I am speaking with the correct person using two identifiers.  Location: Patient: At home Provider: [address], [place], [place], [address]    I discussed the limitations, risks, security and privacy concerns of performing an evaluation and management service by telephone and the availability of in person appointments. I also discussed with the patient that there may be a patient responsible charge related to this service. The patient expressed understanding and agreed to proceed.   Shared Decision Making Visit Lung Cancer Screening Program [phone])   Eligibility: Age 60 y.o. Pack Years Smoking History Calculation 84 pack year smoking history (# packs/per year x # years smoked) Recent History of coughing up blood  no Unexplained weight loss? no ( >Than 15 pounds within the last 6 months ) Prior History Lung / other cancer no (Diagnosis within the last 5 years already requiring surveillance chest CT Scans). Smoking Status Current Smoker Former Smokers: Years since quit:  NA  Quit Date: NA  Visit Components: Discussion included one or more decision making aids. yes Discussion included risk/benefits of screening. yes Discussion included potential follow up diagnostic testing for abnormal scans. yes Discussion included meaning and risk of over diagnosis. yes Discussion included meaning and risk of False Positives. yes Discussion included meaning of total radiation exposure. yes  Counseling Included: Importance of adherence to annual lung cancer LDCT screening. yes Impact of comorbidities on ability to participate in the program. yes Ability and willingness to under diagnostic treatment. yes  Smoking Cessation Counseling: Current Smokers:  Discussed importance of smoking cessation. yes Information about tobacco cessation classes and interventions  provided to patient. yes Patient provided with "ticket" for LDCT Scan. yes Symptomatic Patient. no  Counseling Diagnosis Code: Tobacco Use Z72.0 Asymptomatic Patient yes  Counseling (Intermediate counseling: > three minutes counseling) K9381 Former Smokers:  Discussed the importance of maintaining cigarette abstinence. yes Diagnosis Code: Personal History of Nicotine Dependence. W29.937 Information about tobacco cessation classes and interventions provided to patient. Yes Patient provided with "ticket" for LDCT Scan. yes Written Order for Lung Cancer Screening with LDCT placed in Epic. Yes (CT Chest Lung Cancer Screening Low Dose W/O CM) [id number] Z12.2-Screening of respiratory organs Z87.891-Personal history of nicotine dependence  I have spent 25 minutes of  non-face to face time with  Jenna Wood discussing the risks and benefits of lung cancer screening. We viewed a power point together that explained in detail the above noted topics. We paused at intervals to allow for questions to be asked and answered to ensure understanding.We discussed that the single most powerful action that she can take to decrease her risk of developing lung cancer is to quit smoking. We discussed whether or not she is ready to commit to setting a quit date. We discussed options for tools to aid in quitting smoking including nicotine replacement therapy, non-nicotine medications, support groups, Quit Smart classes, and behavior modification. We discussed that often times setting smaller, more achievable goals, such as eliminating 1 cigarette a day for a week and then 2 cigarettes a day for a week can be helpful in slowly decreasing the number of cigarettes smoked. This allows for a sense of accomplishment as well as providing a clinical benefit. I gave her the " Be Stronger Than Your Excuses" card with contact information for community resources, classes, free nicotine replacement therapy, and access to mobile apps, text  messaging, and on-line smoking cessation help. I have also given her my card and contact information in the event she needs to contact me. We discussed the time and location of the scan, and that either Doroteo Glassman RN or I will call with the results within 24-48 hours of receiving them. I have offered her  a copy of the power point we viewed  as a resource in the event they need reinforcement of the concepts we discussed today in the office. The patient verbalized understanding of all of  the above and had no further questions upon leaving the office. They have my contact information in the event they have any further questions.  I spent 3-4 minutes counseling on smoking cessation and the health risks of continued tobacco abuse.She was offered counseling with pharmacy which she declined. I have told her to call us for help whenever she is ready to quit.   I explained to the patient that there has been a high incidence of coronary artery disease noted on these exams. I explained that this is a non-gated exam therefore degree or severity cannot be determined. This patient is on statin therapy. I have asked the patient to follow-up with their PCP regarding any incidental finding of coronary artery disease and management with diet or medication as their PCP  feels is clinically indicated. The patient verbalized understanding of the above and had no further questions upon completion of the visit.      Magdalen Spatz, NP 05/01/2021

## 2021-05-01 NOTE — Patient Instructions (Signed)
Thank you for participating in the [facility]. It was our pleasure to meet you today. We will call you with the results of your scan within the next few days. Your scan will be assigned a Lung RADS category score by the physicians reading the scans.  This Lung RADS score determines follow up scanning.  See below for description of categories, and follow up screening recommendations. We will be in touch to schedule your follow up screening annually or based on recommendations of our providers. We will fax a copy of your scan results to your Primary Care Physician, or the physician who referred you to the program, to ensure they have the results. Please call the office if you have any questions or concerns regarding your scanning experience or results.  Our office number is [phone]. Please speak with Denise Phelps, RN. She is our Lung Cancer Screening RN. If she is unavailable when you call, please have the office staff send her a message. She will return your call at her earliest convenience. Remember, if your scan is normal, we will scan you annually as long as you continue to meet the criteria for the program. (Age 55-77, Current smoker or smoker who has quit within the last 15 years). If you are a smoker, remember, quitting is the single most powerful action that you can take to decrease your risk of lung cancer and other pulmonary, breathing related problems. We know quitting is hard, and we are here to help.  Please let us know if there is anything we can do to help you meet your goal of quitting. If you are a former smoker, congratulations. We are proud of you! Remain smoke free! Remember you can refer friends or family members through the number above.  We will screen them to make sure they meet criteria for the program. Thank you for helping us take better care of you by participating in Lung Screening.  Lung RADS Categories:  Lung RADS 1: no nodules  or definitely non-concerning nodules.  Recommendation is for a repeat annual scan in 12 months.  Lung RADS 2:  nodules that are non-concerning in appearance and behavior with a very low likelihood of becoming an active cancer. Recommendation is for a repeat annual scan in 12 months.  Lung RADS 3: nodules that are probably non-concerning , includes nodules with a low likelihood of becoming an active cancer.  Recommendation is for a 6-month repeat screening scan. Often noted after an upper respiratory illness. We will be in touch to make sure you have no questions, and to schedule your 6-month scan.  Lung RADS 4 A: nodules with concerning findings, recommendation is most often for a follow up scan in 3 months or additional testing based on our provider's assessment of the scan. We will be in touch to make sure you have no questions and to schedule the recommended 3 month follow up scan.  Lung RADS 4 B:  indicates findings that are concerning. We will be in touch with you to schedule additional diagnostic testing based on our provider's  assessment of the scan.   

## 2021-05-07 ENCOUNTER — Other Ambulatory Visit: Payer: Self-pay | Admitting: Acute Care

## 2021-05-07 DIAGNOSIS — R911 Solitary pulmonary nodule: Secondary | ICD-10-CM

## 2021-05-07 NOTE — Progress Notes (Signed)
I have called the patient with the results of her low dose CT. I have explained that we will need to do some further diagnostics to better evaluate a pulmonary nodule noted in  Right lung apex. I have reviewed the scan with Dr.Icard . I have placed an order for PET scan and PFT's. She needs to be scheduled with Dr. Valeta Harms in one of his Pulmonary nodule slots next week. OK to see Icard before PET and PFT's are done. Pt. Has verbalized understanding. She understands the office will call to schedule these appointments.   Langley Gauss, this patient does not have a PCP.   Triage, please schedule patient in one of the open nodule slots with Dr. Valeta Harms next week. Call her and let her know her options for dates and times. Dr. Valeta Harms  told me he has openings. Thanks so much  Dr. Melvyn Novas, just so you are aware of what is going on. Thanks

## 2021-05-07 NOTE — Progress Notes (Unsigned)
PET

## 2021-05-07 NOTE — Progress Notes (Unsigned)
I have called the patient with the results of her low dose CT. I have explained that we will need to do some further diagnostics to better evaluate a pulmonary nodule noted in  Right lung apex. I have reviewed the scan with DrValeta Harms . I have placed an order for PET scan and PFT's. She needs to be scheduled with Dr. Valeta Harms in one of his Pulmonary nodule slots next week. OK to see Icard before PET and PFT's are done. Pt. Has verbalized understanding. She understands the office will call to schedule these appointments.

## 2021-05-16 ENCOUNTER — Other Ambulatory Visit: Payer: Self-pay

## 2021-05-16 ENCOUNTER — Encounter: Payer: Self-pay | Admitting: Pulmonary Disease

## 2021-05-16 ENCOUNTER — Ambulatory Visit: Payer: Medicare HMO | Admitting: Pulmonary Disease

## 2021-05-16 VITALS — BP 142/78 | HR 105 | Ht 65.0 in | Wt 315.0 lb

## 2021-05-16 DIAGNOSIS — R911 Solitary pulmonary nodule: Secondary | ICD-10-CM

## 2021-05-16 NOTE — Progress Notes (Signed)
Synopsis: Referred in July 2022 for LDCT by No ref. provider found  Subjective:   PATIENT ID: Jenna Wood GENDER: female DOB: 25-Jul-1961, MRN: [id number]  Chief Complaint  Patient presents with   Consult    Shortness of breath and pulmonary nodule    This is a 60 year old female, longstanding history of tobacco abuse since age 61, currently smokes 1.5 packs/day.  She was enrolled in lung cancer screening program and had an abnormal low-dose lung cancer screening CT completed last month.  Patient has past medical history of COPD, chronic pain, anxiety and depression.  She also has a BMI of 52.Patient is a low-dose lung cancer screening CT was completed on 05/01/2021 which revealed a new right apical nodule that was 11.5 mm in size.  Patient was referred to see me to discuss potential for surgical evaluation, or possibility of biopsy.  Patient also had pulmonary function test that were ordered by Eric Form, NP as well as a nuclear medicine PET scan.  Both of these have not been completed yet.  Interested in quitting smoking but has not been able to due to the stressful nature of her   Past Medical History:  Diagnosis Date   Anxiety    Chronic back pain    COPD (chronic obstructive pulmonary disease) (HCC)    Depression    DJD (degenerative joint disease)    Essential hypertension    Fibromyalgia    Gallstones    GERD (gastroesophageal reflux disease)    Hyperlipidemia    Migraine    Pancreatitis      Family History  Problem Relation Age of Onset   Lung cancer Father    Lung cancer Brother    Diabetes Mother    Hypertension Mother    Anemia Mother    Breast cancer Maternal Aunt    Colon cancer Neg Hx    Esophageal cancer Neg Hx    Pancreatic cancer Neg Hx    Stomach cancer Neg Hx    Liver disease Neg Hx      Past Surgical History:  Procedure Laterality Date   CHOLECYSTECTOMY     WISDOM TOOTH EXTRACTION      Social History   Socioeconomic History   Marital  status: Divorced    Spouse name: Not on file   Number of children: Not on file   Years of education: Not on file   Highest education level: Not on file  Occupational History   Not on file  Tobacco Use   Smoking status: Every Day    Packs/day: 2.00    Years: 42.00    Pack years: 84.00    Types: Cigarettes    Start date: 9   Smokeless tobacco: Never   Tobacco comments:    Started smoking at age 26  Vaping Use   Vaping Use: Former   Start date: 11/10/2013   Quit date: 11/10/2014  Substance and Sexual Activity   Alcohol use: No   Drug use: No   Sexual activity: Not on file  Other Topics Concern   Not on file  Social History Narrative   Not on file   Social Determinants of Health   Financial Resource Strain: Not on file  Food Insecurity: Not on file  Transportation Needs: Not on file  Physical Activity: Not on file  Stress: Not on file  Social Connections: Not on file  Intimate Partner Violence: Not on file     No Known Allergies   Outpatient Medications Prior  to Visit  Medication Sig Dispense Refill   acetaminophen (TYLENOL) 325 MG tablet Take 2 tablets (650 mg total) by mouth every 6 (six) hours as needed for mild pain or moderate pain. 30 tablet 0   albuterol (PROVENTIL HFA;VENTOLIN HFA) 108 (90 BASE) MCG/ACT inhaler Inhale 2 puffs into the lungs every 4 (four) hours as needed for wheezing or shortness of breath.      atorvastatin (LIPITOR) 10 MG tablet Take 10 mg by mouth daily.     brexpiprazole (REXULTI) 2 MG TABS tablet Take 2 mg by mouth daily.     busPIRone (BUSPAR) 15 MG tablet Take 15 mg by mouth 3 (three) times daily.     cyclobenzaprine (FLEXERIL) 10 MG tablet Take 10 mg by mouth at bedtime.  2   DULoxetine (CYMBALTA) 60 MG capsule Take 60 mg by mouth 2 (two) times daily.     ezetimibe (ZETIA) 10 MG tablet Take 10 mg by mouth daily.     furosemide (LASIX) 40 MG tablet Take 40 mg by mouth daily.     LYRICA 150 MG capsule Take 150 mg by mouth 2 (two) times  daily.  2   omeprazole (PRILOSEC) 20 MG capsule Take 30- 60 min before your first and last meals of the day (Patient taking differently: daily. Take 30- 60 min before your first and last meals of the day) 60 capsule 11   pentoxifylline (TRENTAL) 400 MG CR tablet Take 400 mg by mouth 3 (three) times daily.  3   potassium chloride SA (K-DUR,KLOR-CON) 20 MEQ tablet Take 20 mEq by mouth daily.     telmisartan (MICARDIS) 40 MG tablet Take 1 tablet (40 mg total) by mouth daily. 30 tablet 11   Tiotropium Bromide-Olodaterol (STIOLTO RESPIMAT) 2.5-2.5 MCG/ACT AERS Inhale 2 puffs into the lungs daily. 1 each 11   vortioxetine HBr (TRINTELLIX) 20 MG TABS tablet Take 20 mg by mouth daily.     No facility-administered medications prior to visit.    Review of Systems  Constitutional:  Negative for chills, fever, malaise/fatigue and weight loss.  HENT:  Negative for hearing loss, sore throat and tinnitus.   Eyes:  Negative for blurred vision and double vision.  Respiratory:  Positive for cough and shortness of breath. Negative for hemoptysis, sputum production, wheezing and stridor.   Cardiovascular:  Negative for chest pain, palpitations, orthopnea, leg swelling and PND.  Gastrointestinal:  Negative for abdominal pain, constipation, diarrhea, heartburn, nausea and vomiting.  Genitourinary:  Negative for dysuria, hematuria and urgency.  Musculoskeletal:  Negative for joint pain and myalgias.  Skin:  Negative for itching and rash.  Neurological:  Negative for dizziness, tingling, weakness and headaches.  Endo/Heme/Allergies:  Negative for environmental allergies. Does not bruise/bleed easily.  Psychiatric/Behavioral:  Negative for depression. The patient is not nervous/anxious and does not have insomnia.   All other systems reviewed and are negative.   Objective:  Physical Exam Vitals reviewed.  Constitutional:      General: She is not in acute distress.    Appearance: She is well-developed. She is  obese.  HENT:     Head: Normocephalic and atraumatic.  Eyes:     General: No scleral icterus.    Conjunctiva/sclera: Conjunctivae normal.     Pupils: Pupils are equal, round, and reactive to light.  Neck:     Vascular: No JVD.     Trachea: No tracheal deviation.  Cardiovascular:     Rate and Rhythm: Normal rate and regular rhythm.  Heart sounds: Normal heart sounds. No murmur heard. Pulmonary:     Effort: Pulmonary effort is normal. No tachypnea, accessory muscle usage or respiratory distress.     Breath sounds: Normal breath sounds. No stridor. No wheezing, rhonchi or rales.  Abdominal:     General: Bowel sounds are normal. There is no distension.     Palpations: Abdomen is soft.     Tenderness: There is no abdominal tenderness.  Musculoskeletal:        General: No tenderness.     Cervical back: Neck supple.  Lymphadenopathy:     Cervical: No cervical adenopathy.  Skin:    General: Skin is warm and dry.     Capillary Refill: Capillary refill takes less than 2 seconds.     Findings: No rash.  Neurological:     Mental Status: She is alert and oriented to person, place, and time.  Psychiatric:        Behavior: Behavior normal.     Vitals:   05/16/21 1406  BP: (!) 142/78  Pulse: (!) 105  SpO2: 97%  Weight: (!) 315 lb (142.9 kg)  Height: 5\' 5"  (1.651 m)   97% on RA BMI Readings from Last 3 Encounters:  05/16/21 52.42 kg/m  03/13/21 52.42 kg/m  09/27/20 52.45 kg/m   Wt Readings from Last 3 Encounters:  05/16/21 (!) 315 lb (142.9 kg)  03/13/21 (!) 315 lb (142.9 kg)  09/27/20 (!) 315 lb 3.2 oz (143 kg)     CBC    Component Value Date/Time   HGB 15.3 (H) 06/21/2016 1527   HCT 45.0 06/21/2016 1527     Chest Imaging: 05/01/2021 lung cancer screening CT: 11.5 mm right upper lobe apical nodule The patient's images have been independently reviewed by me.    Pulmonary Functions Testing Results: No flowsheet data found.  FeNO:   Pathology:    Echocardiogram:   Heart Catheterization:     Assessment & Plan:   No diagnosis found.  Discussion:  This is a 60 year old female, current smoker, 1.5 packs/day, enrolled in lung cancer screening CT program, found to have an abnormal lung cancer screening CT with a right upper lobe 11.5 mm apical nodule.  Patient was referred for evaluation of this nodule and decisions to be made considerations for surgery versus biopsy versus PET scan.  Plan: I agree the PET scan is be the next best step in evaluating the nodule. Already has a high probability of malignancy in a patient that is longstanding history of smoking, associated emphysema and upper lobe predominant location. We discussed this today in the office and reviewed her images. After PET scan is complete and pulmonary function tests are complete we can discuss next best steps regarding either surgical evaluation or tissue biopsy. Patient is agreeable to this plan. PFTs are to be scheduled prior to next office visit.  Patient's PET scan is scheduled for 15 July.  And I will see her after the PET scan and PFTs are complete.  Hopefully within the next 2 weeks.   Current Outpatient Medications:    acetaminophen (TYLENOL) 325 MG tablet, Take 2 tablets (650 mg total) by mouth every 6 (six) hours as needed for mild pain or moderate pain., Disp: 30 tablet, Rfl: 0   albuterol (PROVENTIL HFA;VENTOLIN HFA) 108 (90 BASE) MCG/ACT inhaler, Inhale 2 puffs into the lungs every 4 (four) hours as needed for wheezing or shortness of breath. , Disp: , Rfl:    atorvastatin (LIPITOR) 10 MG tablet, Take  10 mg by mouth daily., Disp: , Rfl:    brexpiprazole (REXULTI) 2 MG TABS tablet, Take 2 mg by mouth daily., Disp: , Rfl:    busPIRone (BUSPAR) 15 MG tablet, Take 15 mg by mouth 3 (three) times daily., Disp: , Rfl:    cyclobenzaprine (FLEXERIL) 10 MG tablet, Take 10 mg by mouth at bedtime., Disp: , Rfl: 2   DULoxetine (CYMBALTA) 60 MG capsule, Take 60 mg  by mouth 2 (two) times daily., Disp: , Rfl:    ezetimibe (ZETIA) 10 MG tablet, Take 10 mg by mouth daily., Disp: , Rfl:    furosemide (LASIX) 40 MG tablet, Take 40 mg by mouth daily., Disp: , Rfl:    LYRICA 150 MG capsule, Take 150 mg by mouth 2 (two) times daily., Disp: , Rfl: 2   omeprazole (PRILOSEC) 20 MG capsule, Take 30- 60 min before your first and last meals of the day (Patient taking differently: daily. Take 30- 60 min before your first and last meals of the day), Disp: 60 capsule, Rfl: 11   pentoxifylline (TRENTAL) 400 MG CR tablet, Take 400 mg by mouth 3 (three) times daily., Disp: , Rfl: 3   potassium chloride SA (K-DUR,KLOR-CON) 20 MEQ tablet, Take 20 mEq by mouth daily., Disp: , Rfl:    telmisartan (MICARDIS) 40 MG tablet, Take 1 tablet (40 mg total) by mouth daily., Disp: 30 tablet, Rfl: 11   Tiotropium Bromide-Olodaterol (STIOLTO RESPIMAT) 2.5-2.5 MCG/ACT AERS, Inhale 2 puffs into the lungs daily., Disp: 1 each, Rfl: 11   vortioxetine HBr (TRINTELLIX) 20 MG TABS tablet, Take 20 mg by mouth daily., Disp: , Rfl:   I spent 62 minutes dedicated to the care of this patient on the date of this encounter to include pre-visit review of records, face-to-face time with the patient discussing conditions above, post visit ordering of testing, clinical documentation with the electronic health record, making appropriate referrals as documented, and communicating necessary findings to members of the patients care team.   Garner Nash, [facility] 05/16/2021 2:28 PM

## 2021-05-16 NOTE — Patient Instructions (Signed)
Thank you for visiting Dr. Valeta Harms at [facility]. Today we recommend the following:  PET scan coming up See me afterwards  Work on quitting smoking   Return in about 3 weeks (around 06/06/2021) for w. Dr. Valeta Harms.    Please do your part to reduce the spread of COVID-19.

## 2021-05-24 ENCOUNTER — Encounter (HOSPITAL_COMMUNITY)
Admission: RE | Admit: 2021-05-24 | Discharge: 2021-05-24 | Disposition: A | Discharge status: Home / Self Care | Payer: Medicare HMO | Source: Ambulatory Visit | Attending: Acute Care | Admitting: Acute Care

## 2021-05-24 ENCOUNTER — Other Ambulatory Visit: Payer: Self-pay

## 2021-05-24 DIAGNOSIS — R911 Solitary pulmonary nodule: Secondary | ICD-10-CM | POA: Insufficient documentation

## 2021-05-24 DIAGNOSIS — K760 Fatty (change of) liver, not elsewhere classified: Secondary | ICD-10-CM | POA: Insufficient documentation

## 2021-05-24 LAB — GLUCOSE, CAPILLARY: Glucose-Capillary: 174 mg/dL — ABNORMAL HIGH (ref 70–99)

## 2021-05-24 MED ORDER — FLUDEOXYGLUCOSE F - 18 (FDG) INJECTION
15.7500 | Freq: Once | INTRAVENOUS | Status: AC
  Administered 2021-05-24: 15.72 via INTRAVENOUS

## 2021-06-06 ENCOUNTER — Ambulatory Visit: Payer: Medicare HMO | Admitting: Pulmonary Disease

## 2021-06-11 ENCOUNTER — Other Ambulatory Visit: Payer: Self-pay | Admitting: *Deleted

## 2021-06-11 DIAGNOSIS — F17210 Nicotine dependence, cigarettes, uncomplicated: Secondary | ICD-10-CM

## 2021-06-11 DIAGNOSIS — Z87891 Personal history of nicotine dependence: Secondary | ICD-10-CM

## 2021-06-11 NOTE — Progress Notes (Signed)
I have called the patient with the results of her PET scan. There was no metabolic activity noted within the nodule of concern . Recommendation was for return to annual low Dose Ct Screening. She verbalized understanding and had no further questions at completion of the call. Langley Gauss, Annual CT 05/2022. Fax results to PCP. Thanks so much

## 2021-06-18 ENCOUNTER — Other Ambulatory Visit: Payer: Self-pay | Admitting: Internal Medicine

## 2021-06-18 DIAGNOSIS — J45991 Cough variant asthma: Secondary | ICD-10-CM

## 2021-06-18 MED ORDER — STIOLTO RESPIMAT 2.5-2.5 MCG/ACT IN AERS: 2.0000 | INHALATION_SPRAY | Freq: Every day | RESPIRATORY_TRACT | 3 refills | Status: DC

## 2021-07-01 ENCOUNTER — Ambulatory Visit (INDEPENDENT_AMBULATORY_CARE_PROVIDER_SITE_OTHER): Payer: Medicare HMO | Admitting: Pulmonary Disease

## 2021-07-01 ENCOUNTER — Encounter: Payer: Self-pay | Admitting: Pulmonary Disease

## 2021-07-01 DIAGNOSIS — J449 Chronic obstructive pulmonary disease, unspecified: Secondary | ICD-10-CM

## 2021-07-01 DIAGNOSIS — R911 Solitary pulmonary nodule: Secondary | ICD-10-CM

## 2021-07-01 DIAGNOSIS — Z87891 Personal history of nicotine dependence: Secondary | ICD-10-CM | POA: Diagnosis not present

## 2021-07-01 DIAGNOSIS — F17210 Nicotine dependence, cigarettes, uncomplicated: Secondary | ICD-10-CM

## 2021-07-01 NOTE — Progress Notes (Signed)
Virtual Visit via Telephone Note  I connected with Jenna Wood on 07/01/21 at 11:00 AM EDT by telephone and verified that I am speaking with the correct person using two identifiers.  Location: Patient: Home Provider: Office    I discussed the limitations, risks, security and privacy concerns of performing an evaluation and management service by telephone and the availability of in person appointments. I also discussed with the patient that there may be a patient responsible charge related to this service. The patient expressed understanding and agreed to proceed.  History of Present Illness:  This is a 60 year old female, longstanding history of tobacco abuse since age 66, currently smokes 1.5 packs/day.  She was enrolled in lung cancer screening program and had an abnormal low-dose lung cancer screening CT completed last month.  Patient has past medical history of COPD, chronic pain, anxiety and depression.  She also has a BMI of 52.Patient is a low-dose lung cancer screening CT was completed on 05/01/2021 which revealed a new right apical nodule that was 11.5 mm in size.  Patient was referred to see me to discuss potential for surgical evaluation, or possibility of biopsy.  Patient also had pulmonary function test that were ordered by Eric Form, NP as well as a nuclear medicine PET scan.  Both of these have not been completed yet.  Interested in quitting smoking but has not been able to due to the stressful nature of her household environment.  OV 07/01/2021: Here today for follow-up regarding lung nodule.  Since last office visit patient underwent nuclear medicine pet imaging.  PET scan revealed no hypermetabolic uptake within the right upper lobe nodule.  From a respiratory standpoint she has no significant complaints today.    Observations/Objective:  05/24/2021: PET imaging Reviewed with patient today. No uptake on pet in nodule The patient's images have been independently reviewed by  me.    Assessment and Plan:  RUL lung nodule  Current smoker  P: Discussed repeat imaging vs bronchoscopy  Patient has agreed to conservative follow up She would like to follow up in 6 months instead of a year  Counseled on smoking cessation   Follow Up Instructions:   I discussed the assessment and treatment plan with the patient. The patient was provided an opportunity to ask questions and all were answered. The patient agreed with the plan and demonstrated an understanding of the instructions.   The patient was advised to call back or seek an in-person evaluation if the symptoms worsen or if the condition fails to improve as anticipated.  I provided 22 minutes of non-face-to-face time during this encounter.   Garner Nash, DO

## 2021-07-01 NOTE — Patient Instructions (Addendum)
Thank you for visiting Dr. Valeta Harms at [facility]. Today we recommend the following:  Orders Placed This Encounter  Procedures   CT Super D Chest Wo Contrast   Follow up with me in 6 months after CT   Return in about 6 months (around 01/01/2022) for with APP or Dr. Valeta Harms.    Please do your part to reduce the spread of COVID-19.

## 2021-08-03 DIAGNOSIS — J189 Pneumonia, unspecified organism: Secondary | ICD-10-CM | POA: Insufficient documentation

## 2021-08-03 DIAGNOSIS — Z794 Long term (current) use of insulin: Secondary | ICD-10-CM

## 2021-08-03 DIAGNOSIS — Z720 Tobacco use: Secondary | ICD-10-CM | POA: Insufficient documentation

## 2021-08-03 DIAGNOSIS — I739 Peripheral vascular disease, unspecified: Secondary | ICD-10-CM | POA: Insufficient documentation

## 2021-08-03 DIAGNOSIS — R0902 Hypoxemia: Secondary | ICD-10-CM | POA: Insufficient documentation

## 2021-08-03 DIAGNOSIS — E119 Type 2 diabetes mellitus without complications: Secondary | ICD-10-CM | POA: Insufficient documentation

## 2021-08-03 HISTORY — DX: Type 2 diabetes mellitus without complications: E11.9

## 2021-08-03 HISTORY — DX: Type 2 diabetes mellitus without complications: Z79.4

## 2021-08-07 ENCOUNTER — Other Ambulatory Visit: Payer: Self-pay | Admitting: Family Medicine

## 2021-08-13 ENCOUNTER — Other Ambulatory Visit: Payer: Self-pay | Admitting: Family Medicine

## 2021-08-21 ENCOUNTER — Inpatient Hospital Stay: Payer: Medicare HMO | Admitting: Pulmonary Disease

## 2021-08-27 ENCOUNTER — Other Ambulatory Visit (HOSPITAL_COMMUNITY): Payer: Self-pay | Admitting: General Surgery

## 2021-08-27 DIAGNOSIS — J441 Chronic obstructive pulmonary disease with (acute) exacerbation: Secondary | ICD-10-CM

## 2021-09-06 ENCOUNTER — Telehealth: Payer: Self-pay | Admitting: Pulmonary Disease

## 2021-09-06 ENCOUNTER — Inpatient Hospital Stay: Payer: Medicare HMO | Admitting: Pulmonary Disease

## 2021-09-06 MED ORDER — TRELEGY ELLIPTA 200-62.5-25 MCG/ACT IN AEPB: 1.0000 | INHALATION_SPRAY | Freq: Every day | RESPIRATORY_TRACT | 11 refills | Status: DC

## 2021-09-06 NOTE — Telephone Encounter (Signed)
Spoke to patient, who stated that she was given trelegy during recent admission. She would like a Rx, as trelegy works well for her.  Dr. Valeta Harms, please advise. Thank

## 2021-09-06 NOTE — Telephone Encounter (Signed)
Trelegy has been sent to preferred pharmacy.  Patient is aware and voiced her understanding.  Nothing further needed at this time.

## 2021-09-24 ENCOUNTER — Other Ambulatory Visit (HOSPITAL_COMMUNITY): Payer: Self-pay | Admitting: Family Medicine

## 2021-09-24 ENCOUNTER — Other Ambulatory Visit (HOSPITAL_COMMUNITY): Payer: Self-pay | Admitting: Physician Assistant

## 2021-09-25 ENCOUNTER — Other Ambulatory Visit: Payer: Self-pay

## 2021-09-25 ENCOUNTER — Encounter: Payer: Self-pay | Admitting: Pulmonary Disease

## 2021-09-25 ENCOUNTER — Ambulatory Visit: Payer: Medicare HMO | Admitting: Pulmonary Disease

## 2021-09-25 VITALS — BP 118/68 | HR 121 | Temp 98.1°F | Ht 65.0 in | Wt 298.4 lb

## 2021-09-25 DIAGNOSIS — R911 Solitary pulmonary nodule: Secondary | ICD-10-CM | POA: Diagnosis not present

## 2021-09-25 DIAGNOSIS — J449 Chronic obstructive pulmonary disease, unspecified: Secondary | ICD-10-CM

## 2021-09-25 DIAGNOSIS — Z8701 Personal history of pneumonia (recurrent): Secondary | ICD-10-CM

## 2021-09-25 DIAGNOSIS — Z87891 Personal history of nicotine dependence: Secondary | ICD-10-CM | POA: Diagnosis not present

## 2021-09-25 NOTE — Progress Notes (Signed)
Synopsis: Referred in July 2022 for LDCT by Monico Blitz, MD  Subjective:   PATIENT ID: Jenna Wood GENDER: female DOB: 15-Feb-1961, MRN: [id number]  Chief Complaint  Patient presents with   Follow-up    Follow up on hospital    This is a 60 year old female, longstanding history of tobacco abuse since age 55, currently smokes 1.5 packs/day.  She was enrolled in lung cancer screening program and had an abnormal low-dose lung cancer screening CT completed last month.  Patient has past medical history of COPD, chronic pain, anxiety and depression.  She also has a BMI of 52.Patient is a low-dose lung cancer screening CT was completed on 05/01/2021 which revealed a new right apical nodule that was 11.5 mm in size.  Patient was referred to see me to discuss potential for surgical evaluation, or possibility of biopsy.  Patient also had pulmonary function test that were ordered by Eric Form, NP as well as a nuclear medicine PET scan.  Both of these have not been completed yet.  Interested in quitting smoking but has not been able to due to the stressful nature of her household environment.  OV 09/25/2021: After previous office visit patient had a nuclear medicine PET scan that was completed in August 2022.  This revealed no significant hypermetabolic uptake within the lesion that was identified described above.  Decision was made for remain repeat noncontrasted CT scan of the chest in 6 months.  In this weight.  Unfortunately she was admitted to [facility] and [place].  She had pneumonia.  She was treated with antibiotics and steroids discharged on doxycycline and prednisone.  She was also switched from [employer] to [employer].  She is doing well on this.  Unfortunately also has developed oxygen requirement.  Needing O2 supplementation.  Now on 2 L.   Past Medical History:  Diagnosis Date   Anxiety    Chronic back pain    COPD (chronic obstructive pulmonary disease) (HCC)    Depression     DJD (degenerative joint disease)    Essential hypertension    Fibromyalgia    Gallstones    GERD (gastroesophageal reflux disease)    Hyperlipidemia    Migraine    Pancreatitis      Family History  Problem Relation Age of Onset   Lung cancer Father    Lung cancer Brother    Diabetes Mother    Hypertension Mother    Anemia Mother    Breast cancer Maternal Aunt    Colon cancer Neg Hx    Esophageal cancer Neg Hx    Pancreatic cancer Neg Hx    Stomach cancer Neg Hx    Liver disease Neg Hx      Past Surgical History:  Procedure Laterality Date   CHOLECYSTECTOMY     WISDOM TOOTH EXTRACTION      Social History   Socioeconomic History   Marital status: Divorced    Spouse name: Not on file   Number of children: Not on file   Years of education: Not on file   Highest education level: Not on file  Occupational History   Not on file  Tobacco Use   Smoking status: Every Day    Packs/day: 2.00    Years: 42.00    Pack years: 84.00    Types: Cigarettes    Start date: 36   Smokeless tobacco: Never   Tobacco comments:    Currently smoking 1ppd as of 07/01/21 ep  Vaping Use  Vaping Use: Former   Start date: 11/10/2013   Quit date: 11/10/2014  Substance and Sexual Activity   Alcohol use: No   Drug use: No   Sexual activity: Not on file  Other Topics Concern   Not on file  Social History Narrative   Not on file   Social Determinants of Health   Financial Resource Strain: Not on file  Food Insecurity: Not on file  Transportation Needs: Not on file  Physical Activity: Not on file  Stress: Not on file  Social Connections: Not on file  Intimate Partner Violence: Not on file     No Known Allergies   Outpatient Medications Prior to Visit  Medication Sig Dispense Refill   acetaminophen (TYLENOL) 325 MG tablet Take 2 tablets (650 mg total) by mouth every 6 (six) hours as needed for mild pain or moderate pain. 30 tablet 0   albuterol (PROVENTIL HFA;VENTOLIN HFA) 108  (90 BASE) MCG/ACT inhaler Inhale 2 puffs into the lungs every 4 (four) hours as needed for wheezing or shortness of breath.      atorvastatin (LIPITOR) 10 MG tablet Take 10 mg by mouth daily.     brexpiprazole (REXULTI) 2 MG TABS tablet Take 2 mg by mouth daily.     busPIRone (BUSPAR) 15 MG tablet Take 15 mg by mouth 3 (three) times daily.     cyclobenzaprine (FLEXERIL) 10 MG tablet Take 10 mg by mouth at bedtime.  2   DULoxetine (CYMBALTA) 60 MG capsule Take 60 mg by mouth 2 (two) times daily.     ezetimibe (ZETIA) 10 MG tablet Take 10 mg by mouth daily.     Fluticasone-Umeclidin-Vilant (TRELEGY ELLIPTA) 200-62.5-25 MCG/ACT AEPB Inhale 1 puff into the lungs daily. 60 each 11   furosemide (LASIX) 40 MG tablet Take 40 mg by mouth daily.     LYRICA 150 MG capsule Take 150 mg by mouth 2 (two) times daily.  2   omeprazole (PRILOSEC) 20 MG capsule Take 30- 60 min before your first and last meals of the day (Patient taking differently: daily. Take 30- 60 min before your first and last meals of the day) 60 capsule 11   pentoxifylline (TRENTAL) 400 MG CR tablet Take 400 mg by mouth 3 (three) times daily.  3   potassium chloride SA (K-DUR,KLOR-CON) 20 MEQ tablet Take 20 mEq by mouth daily.     telmisartan (MICARDIS) 40 MG tablet Take 1 tablet (40 mg total) by mouth daily. 30 tablet 11   vortioxetine HBr (TRINTELLIX) 20 MG TABS tablet Take 20 mg by mouth daily.     No facility-administered medications prior to visit.    Review of Systems  Constitutional:  Negative for chills, fever, malaise/fatigue and weight loss.  HENT:  Negative for hearing loss, sore throat and tinnitus.   Eyes:  Negative for blurred vision and double vision.  Respiratory:  Positive for cough and shortness of breath. Negative for hemoptysis, sputum production, wheezing and stridor.   Cardiovascular:  Negative for chest pain, palpitations, orthopnea, leg swelling and PND.  Gastrointestinal:  Negative for abdominal pain,  constipation, diarrhea, heartburn, nausea and vomiting.  Genitourinary:  Negative for dysuria, hematuria and urgency.  Musculoskeletal:  Negative for joint pain and myalgias.  Skin:  Negative for itching and rash.  Neurological:  Negative for dizziness, tingling, weakness and headaches.  Endo/Heme/Allergies:  Negative for environmental allergies. Does not bruise/bleed easily.  Psychiatric/Behavioral:  Negative for depression. The patient is not nervous/anxious and does not have  insomnia.   All other systems reviewed and are negative.   Objective:  Physical Exam Vitals reviewed.  Constitutional:      General: She is not in acute distress.    Appearance: She is well-developed. She is obese.  HENT:     Head: Normocephalic and atraumatic.  Eyes:     General: No scleral icterus.    Conjunctiva/sclera: Conjunctivae normal.     Pupils: Pupils are equal, round, and reactive to light.  Neck:     Vascular: No JVD.     Trachea: No tracheal deviation.  Cardiovascular:     Rate and Rhythm: Normal rate and regular rhythm.     Heart sounds: Normal heart sounds. No murmur heard. Pulmonary:     Effort: Pulmonary effort is normal. No tachypnea, accessory muscle usage or respiratory distress.     Breath sounds: No stridor. No wheezing, rhonchi or rales.     Comments: No crackles Abdominal:     General: Bowel sounds are normal. There is no distension.     Palpations: Abdomen is soft.     Tenderness: There is no abdominal tenderness.  Musculoskeletal:        General: No tenderness.     Cervical back: Neck supple.  Lymphadenopathy:     Cervical: No cervical adenopathy.  Skin:    General: Skin is warm and dry.     Capillary Refill: Capillary refill takes less than 2 seconds.     Findings: No rash.  Neurological:     Mental Status: She is alert and oriented to person, place, and time.  Psychiatric:        Behavior: Behavior normal.     Vitals:   09/25/21 1329  BP: 118/68  Pulse: (!)  121  Temp: 98.1 F (36.7 C)  TempSrc: Oral  SpO2: 97%  Weight: 298 lb 6.4 oz (135.4 kg)  Height: 5\' 5"  (1.651 m)   97% on RA BMI Readings from Last 3 Encounters:  09/25/21 49.66 kg/m  05/16/21 52.42 kg/m  03/13/21 52.42 kg/m   Wt Readings from Last 3 Encounters:  09/25/21 298 lb 6.4 oz (135.4 kg)  05/16/21 (!) 315 lb (142.9 kg)  03/13/21 (!) 315 lb (142.9 kg)     CBC    Component Value Date/Time   HGB 15.3 (H) 06/21/2016 1527   HCT 45.0 06/21/2016 1527     Chest Imaging: 05/01/2021 lung cancer screening CT: 11.5 mm right upper lobe apical nodule The patient's images have been independently reviewed by me.    Nuclear medicine pet imaging August 2022: No significant hypermetabolic uptake within the 11 mm right upper lobe pulmonary nodule The patient's images have been independently reviewed by me.    Pulmonary Functions Testing Results: No flowsheet data found.  FeNO:   Pathology:   Echocardiogram:   Heart Catheterization:     Assessment & Plan:     ICD-10-CM   1. Pulmonary nodule seen on imaging study  R91.1     2. Personal history of tobacco use, presenting hazards to health  Z87.891     3. COPD GOLD 0/ still smoking  J44.9     4. Morbid obesity due to excess calories (HCC)  E66.01     5. History of pneumonia  Z87.01       Discussion:  This is a 60 year old female, current smoker down to 1 pack/day, currently enrolled in our lung cancer screening program.  Found to have an abnormal lung cancer screening CT at 80.5  mm right upper lobe nodule.  Patient had a PET scan first that showed no significant hypermetabolic uptake and recommended a 15-month noncontrasted CT scan follow-up at that time.  Unfortunately in between the last time I saw her until now she developed pneumonia and was admitted to the hospital.  Discharged on antibiotics steroids and switched her inhalers to Trelegy.  Plan: I think she can stay on triple therapy inhaler We had  discussions today regarding smoking cessation counseling. Unfortunately she still smoking but has at least cut down to 1 pack/day. Recommend having her noncontrasted CT scan of the chest in February 2023. Patient to follow-up with [place] after the CT scan.  She is open to be quit smoking by the end of the year.   Current Outpatient Medications:    acetaminophen (TYLENOL) 325 MG tablet, Take 2 tablets (650 mg total) by mouth every 6 (six) hours as needed for mild pain or moderate pain., Disp: 30 tablet, Rfl: 0   albuterol (PROVENTIL HFA;VENTOLIN HFA) 108 (90 BASE) MCG/ACT inhaler, Inhale 2 puffs into the lungs every 4 (four) hours as needed for wheezing or shortness of breath. , Disp: , Rfl:    atorvastatin (LIPITOR) 10 MG tablet, Take 10 mg by mouth daily., Disp: , Rfl:    brexpiprazole (REXULTI) 2 MG TABS tablet, Take 2 mg by mouth daily., Disp: , Rfl:    busPIRone (BUSPAR) 15 MG tablet, Take 15 mg by mouth 3 (three) times daily., Disp: , Rfl:    cyclobenzaprine (FLEXERIL) 10 MG tablet, Take 10 mg by mouth at bedtime., Disp: , Rfl: 2   DULoxetine (CYMBALTA) 60 MG capsule, Take 60 mg by mouth 2 (two) times daily., Disp: , Rfl:    ezetimibe (ZETIA) 10 MG tablet, Take 10 mg by mouth daily., Disp: , Rfl:    Fluticasone-Umeclidin-Vilant (TRELEGY ELLIPTA) 200-62.5-25 MCG/ACT AEPB, Inhale 1 puff into the lungs daily., Disp: 60 each, Rfl: 11   furosemide (LASIX) 40 MG tablet, Take 40 mg by mouth daily., Disp: , Rfl:    LYRICA 150 MG capsule, Take 150 mg by mouth 2 (two) times daily., Disp: , Rfl: 2   omeprazole (PRILOSEC) 20 MG capsule, Take 30- 60 min before your first and last meals of the day (Patient taking differently: daily. Take 30- 60 min before your first and last meals of the day), Disp: 60 capsule, Rfl: 11   pentoxifylline (TRENTAL) 400 MG CR tablet, Take 400 mg by mouth 3 (three) times daily., Disp: , Rfl: 3   potassium chloride SA (K-DUR,KLOR-CON) 20 MEQ tablet, Take 20 mEq by mouth daily.,  Disp: , Rfl:    telmisartan (MICARDIS) 40 MG tablet, Take 1 tablet (40 mg total) by mouth daily., Disp: 30 tablet, Rfl: 11   vortioxetine HBr (TRINTELLIX) 20 MG TABS tablet, Take 20 mg by mouth daily., Disp: , Rfl:    Garner Nash, DO [facility] 09/25/2021 1:36 PM

## 2021-09-25 NOTE — Patient Instructions (Signed)
Thank you for visiting Dr. Valeta Harms at [facility]. Today we recommend the following:  Stay on trelegy  Continue albuterol as needed  Repeat CT Chest in Feb 2023, follow up with [place] afterwards.   Return in about 3 months (around 12/26/2021) for with APP or Dr. Valeta Harms.    Please do your part to reduce the spread of COVID-19.

## 2021-12-12 ENCOUNTER — Ambulatory Visit: Payer: Medicare HMO | Admitting: Pulmonary Disease

## 2022-01-01 ENCOUNTER — Ambulatory Visit (HOSPITAL_COMMUNITY): Payer: Medicare HMO

## 2022-01-03 ENCOUNTER — Ambulatory Visit: Payer: Medicare HMO | Admitting: Pulmonary Disease

## 2022-01-13 ENCOUNTER — Ambulatory Visit (INDEPENDENT_AMBULATORY_CARE_PROVIDER_SITE_OTHER): Payer: Medicare HMO

## 2022-01-13 ENCOUNTER — Ambulatory Visit: Payer: Medicare HMO | Admitting: Podiatry

## 2022-01-13 ENCOUNTER — Encounter: Payer: Self-pay | Admitting: Podiatry

## 2022-01-13 ENCOUNTER — Other Ambulatory Visit: Payer: Self-pay

## 2022-01-13 DIAGNOSIS — M722 Plantar fascial fibromatosis: Secondary | ICD-10-CM

## 2022-01-13 MED ORDER — TRIAMCINOLONE ACETONIDE 10 MG/ML IJ SUSP
20.0000 mg | Freq: Once | INTRAMUSCULAR | Status: AC
  Administered 2022-01-13: 20 mg

## 2022-01-13 NOTE — Progress Notes (Signed)
Subjective:  ? ?Patient ID: Jenna Wood, female   DOB: 61 y.o.   MRN: [id number]  ? ?HPI ?Patient has severe discomfort plantar aspect left heel moderate in the right heel with inflammation fluid around the medial band.  States its been going on for a period of time and worse over the last week and does not remember injury.  Patient also has significant lung disease but still smokes approximate 1-1/2 packs of cigarettes per day.  Patient is not active ? ? ?Review of Systems  ?All other systems reviewed and are negative. ? ? ?   ?Objective:  ?Physical Exam ?Vitals and nursing note reviewed.  ?Constitutional:   ?   Appearance: She is well-developed.  ?Pulmonary:  ?   Effort: Pulmonary effort is normal.  ?Musculoskeletal:     ?   General: Normal range of motion.  ?Skin: ?   General: Skin is warm.  ?Neurological:  ?   Mental Status: She is alert.  ?  ?Neurovascular status was found to be intact muscle strength was found to be within normal limits range of motion adequate.  Patient has exquisite discomfort plantar aspect heel left over right with inflammation fluid in the medial band and is found to have good digital perfusion well oriented x3 ? ?   ?Assessment:  ?Acute plantar fasciitis left over right with inflammation fluid buildup around the medial band with depression of the arch also noted left over right calf ? ?   ?Plan:  ?H&P discussed condition at great length and reviewed treatment.  X-rays reviewed today I did sterile prep injected the fascia left over right and both fascias at insertion 3 mg Kenalog 5 mg Xylocaine for the left applied fascial brace to lift up the arch gave instructions for anti-inflammatories and support therapy and advised on shoe gear modifications physical therapy.  Reappoint to recheck again in the next several weeks and encouraged to call questions concerns and brace will be used to lift the arch which is flattened and is putting more pressure on the heel ? ?X-rays indicate spur  formation left over right no indication stress fracture no indications of arthritic issue ?   ? ? ?

## 2022-01-15 ENCOUNTER — Ambulatory Visit (HOSPITAL_COMMUNITY): Payer: Medicare HMO

## 2022-01-20 ENCOUNTER — Other Ambulatory Visit: Payer: Self-pay

## 2022-01-20 ENCOUNTER — Ambulatory Visit (HOSPITAL_COMMUNITY)
Admission: RE | Admit: 2022-01-20 | Discharge: 2022-01-20 | Disposition: A | Discharge status: Home / Self Care | Payer: Medicare HMO | Source: Ambulatory Visit | Attending: Pulmonary Disease | Admitting: Pulmonary Disease

## 2022-01-20 DIAGNOSIS — R911 Solitary pulmonary nodule: Secondary | ICD-10-CM | POA: Diagnosis present

## 2022-01-22 ENCOUNTER — Ambulatory Visit: Payer: Medicare HMO | Admitting: Pulmonary Disease

## 2022-02-05 NOTE — Progress Notes (Signed)
Ty, please let patient know that her Ct demonstrates stability over the past 2 years and that no additional follow up is recommended at this time. However, she likely qualifies for LDCT Screening. Please place AMB Ref to Lung cancer screening program. Annual CT would need to start in March 2024.  ? ?Thanks, ? ?BLI ? ?Garner Nash, DO ?[facility] ?02/05/2022 6:52 PM  ?

## 2022-02-07 ENCOUNTER — Other Ambulatory Visit: Payer: Self-pay

## 2022-02-07 DIAGNOSIS — R911 Solitary pulmonary nodule: Secondary | ICD-10-CM

## 2022-02-07 NOTE — Progress Notes (Signed)
lun 

## 2022-03-19 ENCOUNTER — Other Ambulatory Visit: Payer: Self-pay

## 2022-03-19 DIAGNOSIS — Z87891 Personal history of nicotine dependence: Secondary | ICD-10-CM

## 2022-03-19 DIAGNOSIS — Z122 Encounter for screening for malignant neoplasm of respiratory organs: Secondary | ICD-10-CM

## 2022-03-19 DIAGNOSIS — F17210 Nicotine dependence, cigarettes, uncomplicated: Secondary | ICD-10-CM

## 2022-05-26 ENCOUNTER — Ambulatory Visit: Payer: Medicare HMO | Admitting: Podiatry

## 2022-06-04 ENCOUNTER — Encounter: Payer: Self-pay | Admitting: Podiatry

## 2022-06-04 ENCOUNTER — Ambulatory Visit (INDEPENDENT_AMBULATORY_CARE_PROVIDER_SITE_OTHER): Payer: Medicare HMO | Admitting: Podiatry

## 2022-06-04 DIAGNOSIS — Q828 Other specified congenital malformations of skin: Secondary | ICD-10-CM | POA: Diagnosis not present

## 2022-06-04 DIAGNOSIS — M778 Other enthesopathies, not elsewhere classified: Secondary | ICD-10-CM | POA: Diagnosis not present

## 2022-06-04 DIAGNOSIS — M7751 Other enthesopathy of right foot: Secondary | ICD-10-CM

## 2022-06-05 NOTE — Progress Notes (Signed)
Subjective:   Patient ID: Jenna Wood, female   DOB: 61 y.o.   MRN: [id number]   HPI Patient presents with a lot of inflammation plantar lateral aspect right foot and to very painful lesions which are hard for her to walk on foot   ROS      Objective:  Physical Exam  Neurovascular status was found to be intact with inflammation pain of the base of the fifth metatarsal right and around the peroneal insertion with fluid buildup and 2 separate keratotic lesions plantar right with a lucent course     Assessment:  Inflammatory type capsulitis right of the fifth metatarsal base along with 2 porokeratotic painful lesions plantar right     Plan:  Reviewed both conditions sterile prep and injected the plantar capsule of the fifth MPJ 3 mg Dexasone Kenalog 5 mg Xylocaine and then identified 2 separate lesions plantar aspect right foot that I debrided taking out the cord to reduce the pressure which gave her relief

## 2022-06-20 ENCOUNTER — Ambulatory Visit: Payer: Medicare HMO | Admitting: Acute Care

## 2022-06-22 IMAGING — MG MM DIGITAL DIAGNOSTIC UNILAT*R* W/ TOMO W/ CAD
4 series · 4 of 12 positions shown · non-contrast
Comparison: Previous exam(s).

CLINICAL DATA: 59-year-old female presenting for first six-month
follow-up of a probably benign right breast mass.

EXAM:
DIGITAL DIAGNOSTIC RIGHT MAMMOGRAM WITH CAD AND TOMO
ULTRASOUND RIGHT BREAST

[R CC synth-2D]
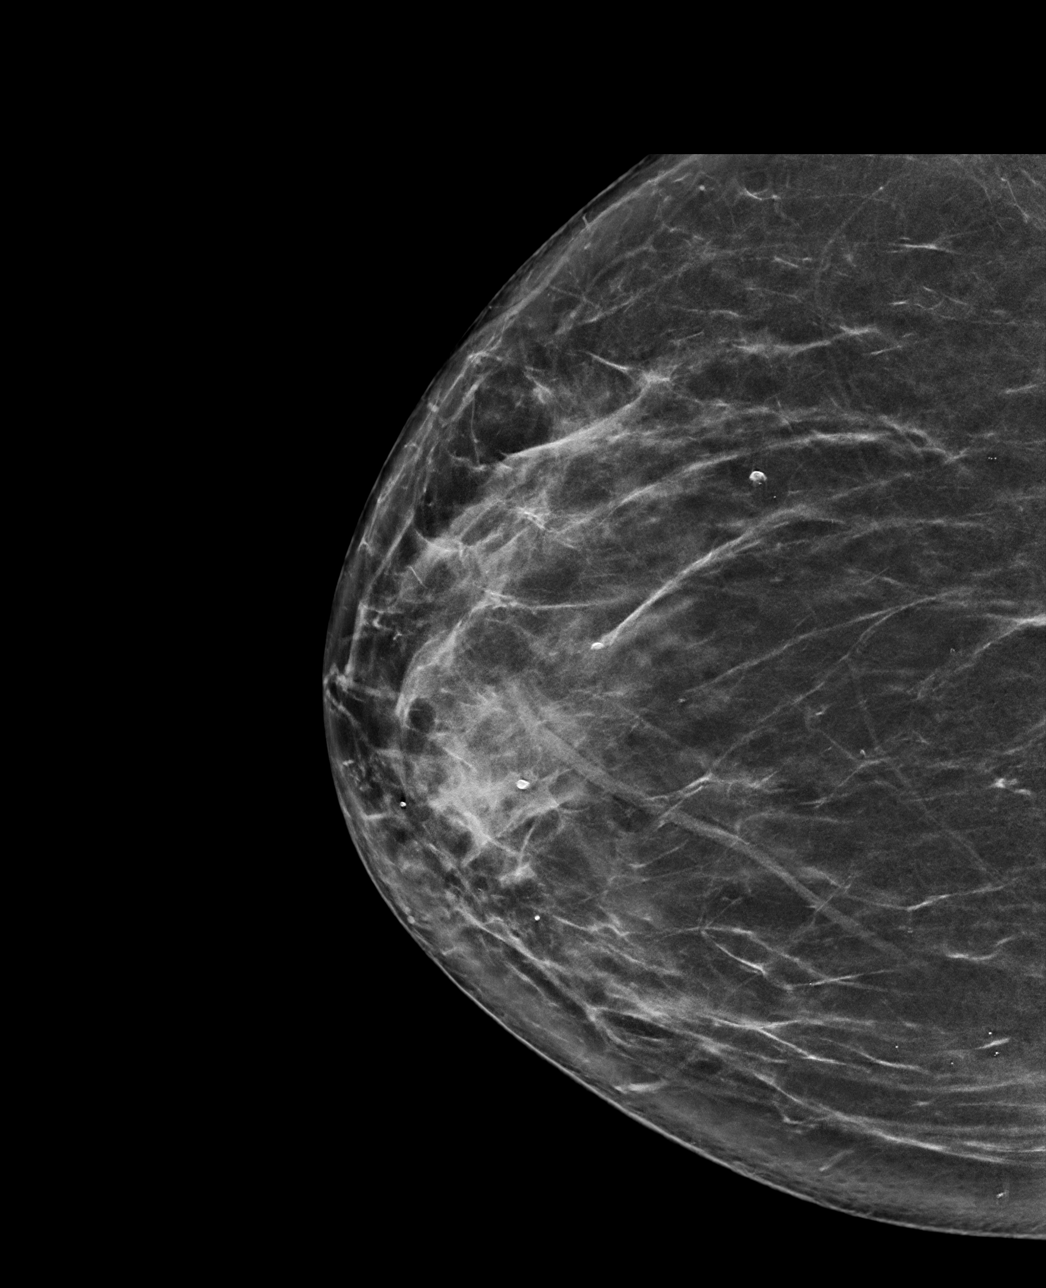

[R MLO synth-2D]
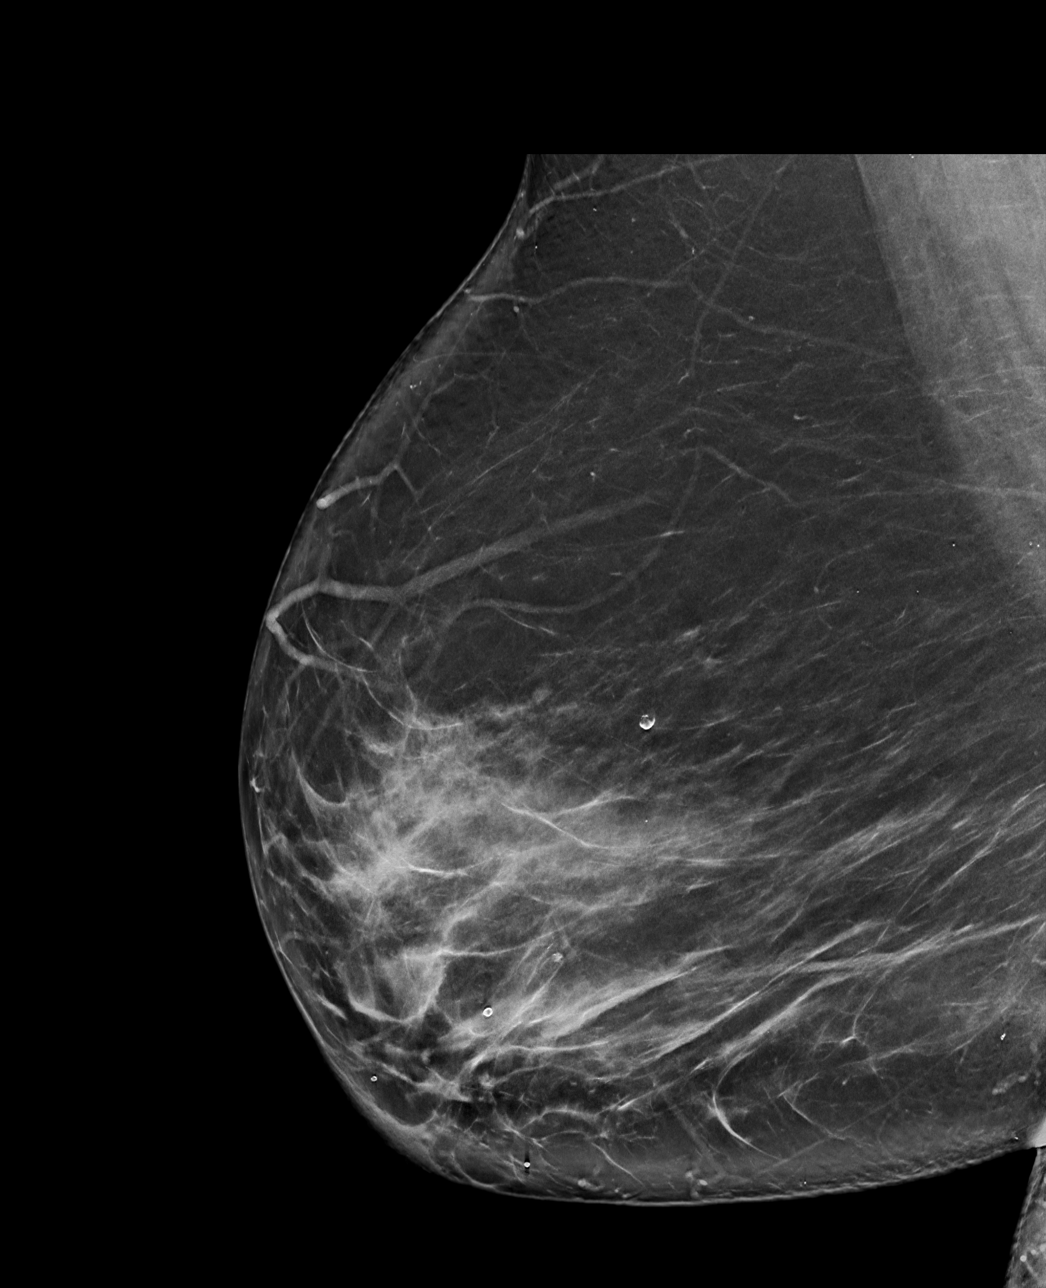

[R MLO tomo · tomo slice 49/97.0]
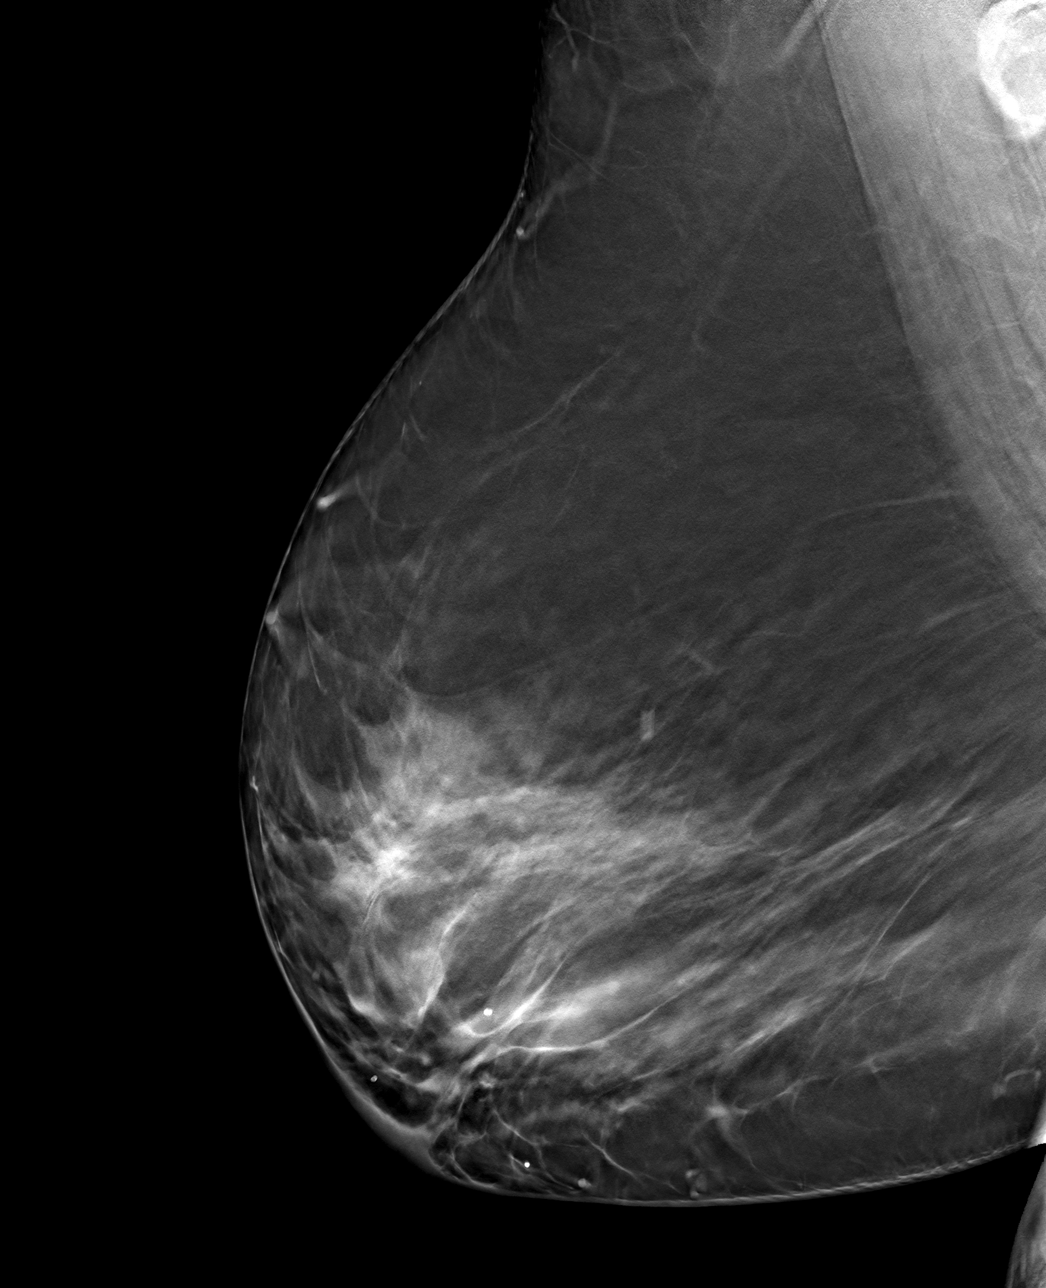

[R CC tomo · tomo slice 44/87.0]
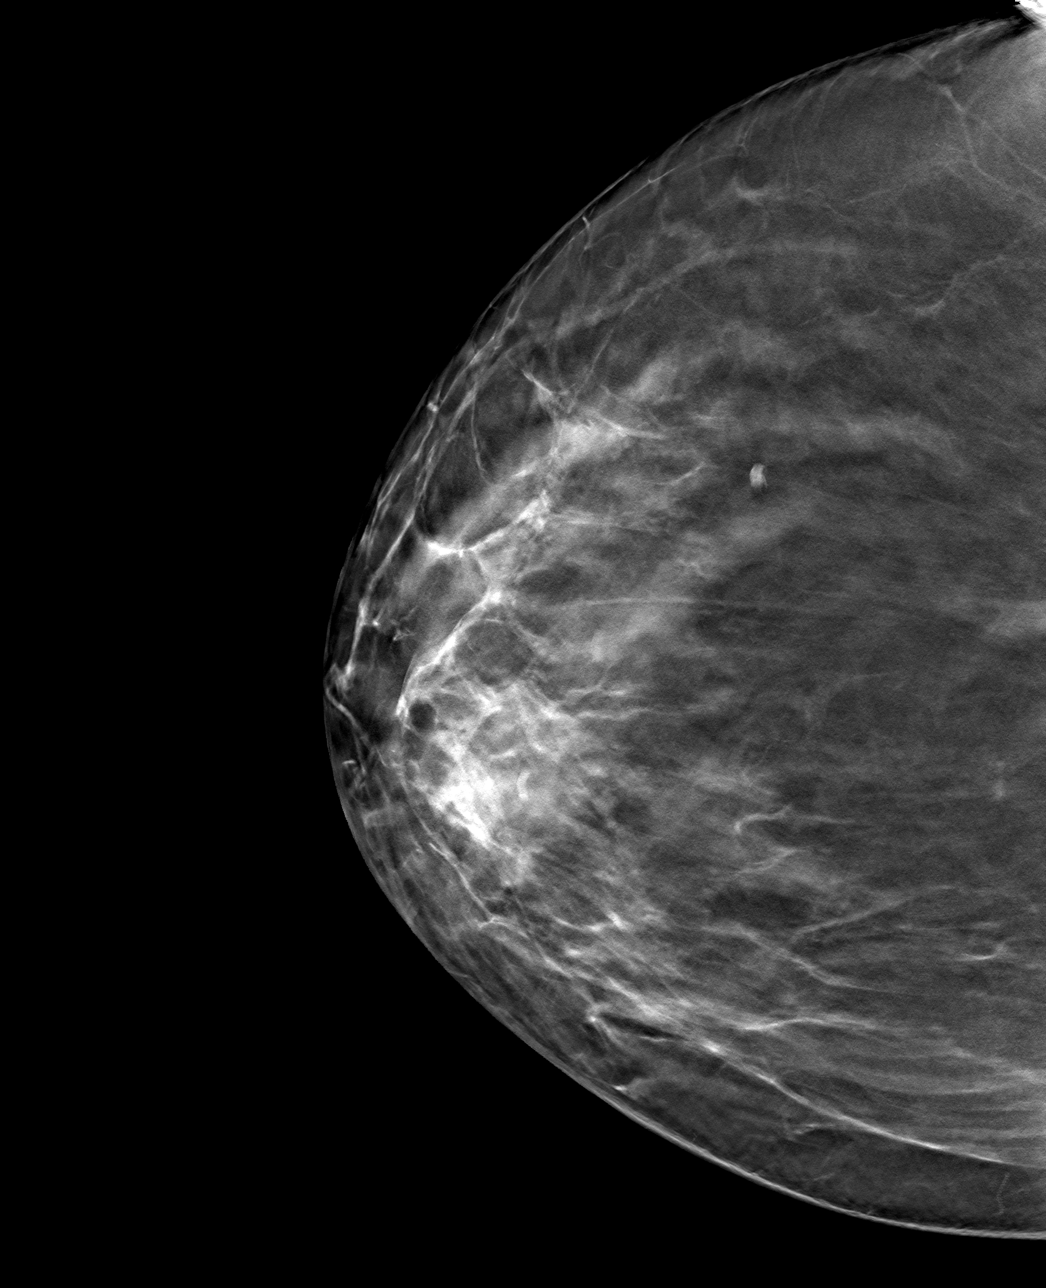

[4 of 12 positions shown; findings below may reference images not displayed]

ACR Breast Density Category c: The breast tissue is heterogeneously
dense, which may obscure small masses.
FINDINGS: Stable appearance of the right breast without new or suspicious
sonographic abnormality.

Mammographic images were processed with CAD.

Targeted ultrasound is performed, showing stable appearance of an
oval, circumscribed hypoechoic mass at the [DATE] position 5 cm from
the nipple. It measures 7 x 5 x 2 mm (previously 7 x 6 x 2 mm).
IMPRESSION: Stable, probably benign right breast mass. Recommendation is for
continued sonographic follow-up at the time of the patient's annual
bilateral mammograms.

RECOMMENDATION:
Bilateral diagnostic mammogram and right breast ultrasound in 6
months.

I have discussed the findings and recommendations with the patient.
If applicable, a reminder letter will be sent to the patient
regarding the next appointment.

BI-RADS CATEGORY  3: Probably benign.

## 2022-06-22 IMAGING — US US BREAST*R* LIMITED INC AXILLA
1 series · 4 of 4 positions shown · non-contrast
Comparison: Previous exam(s).

CLINICAL DATA: 59-year-old female presenting for first six-month
follow-up of a probably benign right breast mass.

EXAM:
DIGITAL DIAGNOSTIC RIGHT MAMMOGRAM WITH CAD AND TOMO
ULTRASOUND RIGHT BREAST

[Series 1: us breast*right* limited inc axilla · 0.06mm/px · 4 of 4 slices shown]
[im 1/4]
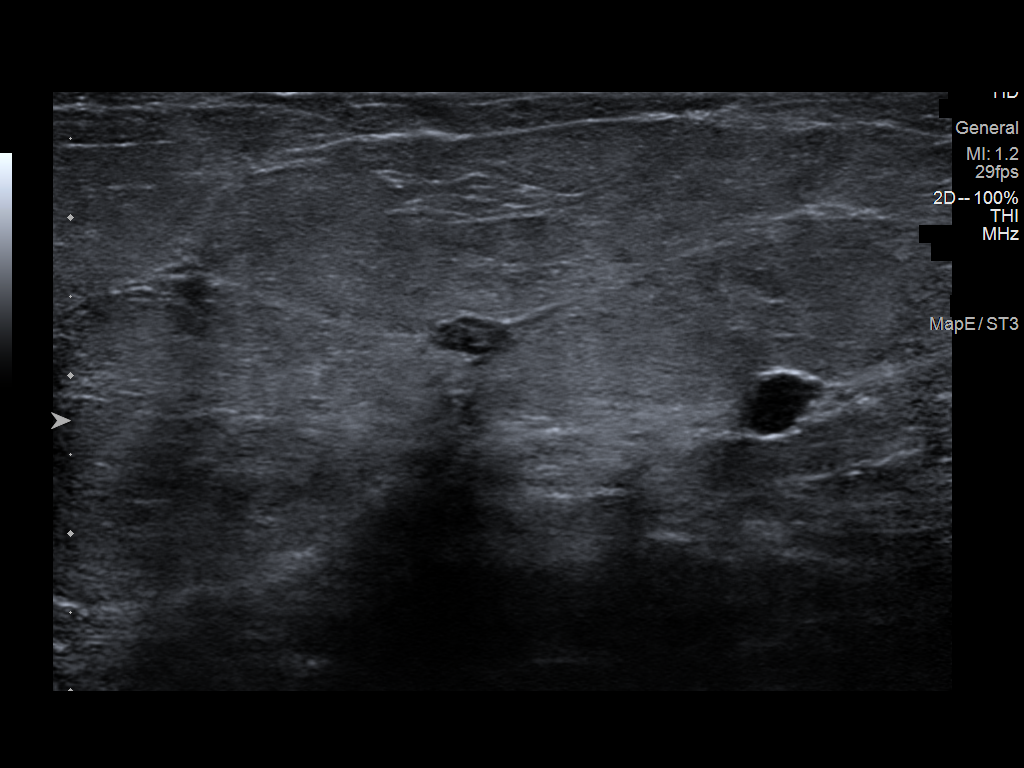
[im 2/4]
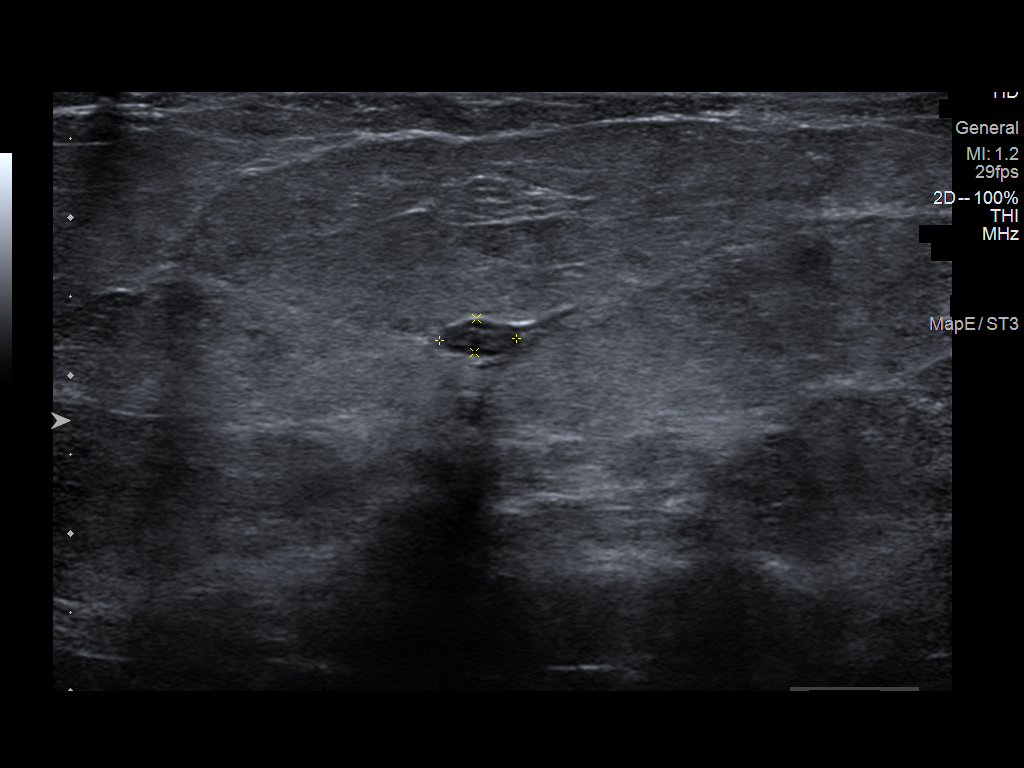
[im 3/4]
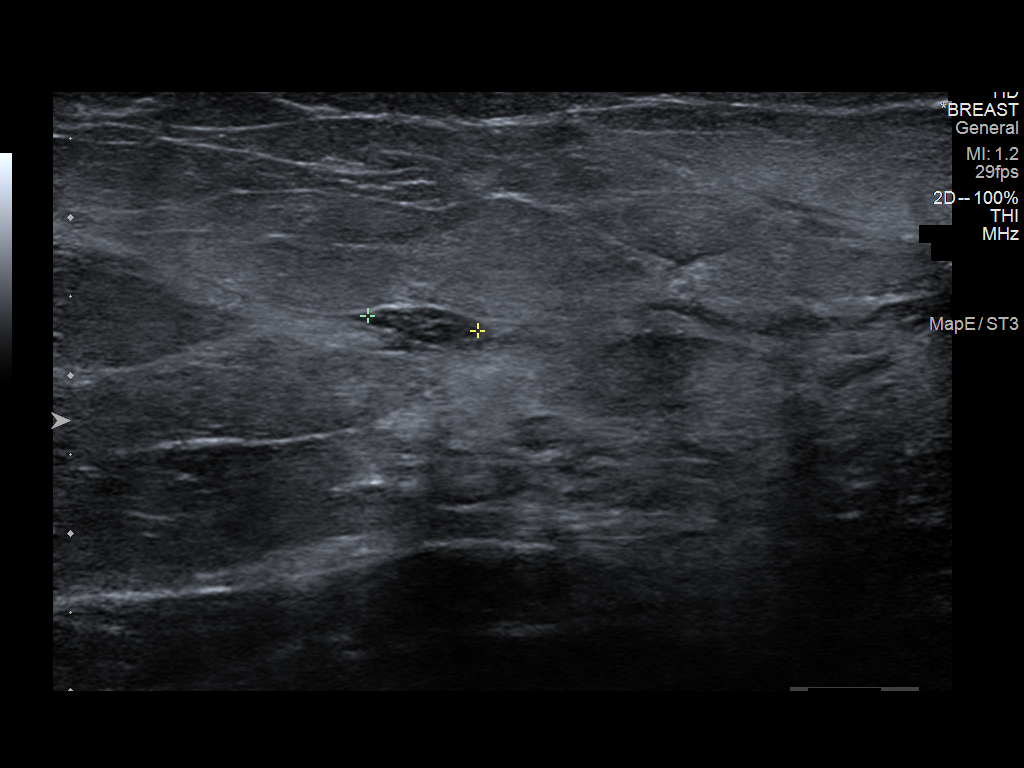
[im 4/4]
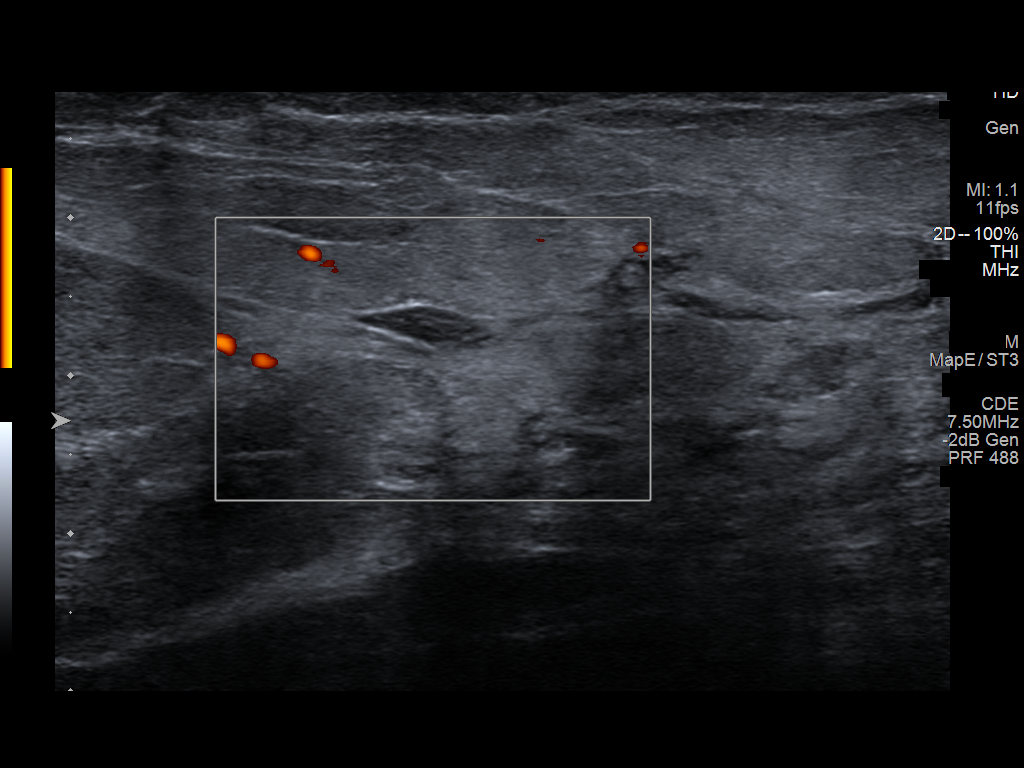

[4 of 4 positions shown; findings below may reference images not displayed]

ACR Breast Density Category c: The breast tissue is heterogeneously
dense, which may obscure small masses.
FINDINGS: Stable appearance of the right breast without new or suspicious
sonographic abnormality.

Mammographic images were processed with CAD.

Targeted ultrasound is performed, showing stable appearance of an
oval, circumscribed hypoechoic mass at the [DATE] position 5 cm from
the nipple. It measures 7 x 5 x 2 mm (previously 7 x 6 x 2 mm).
IMPRESSION: Stable, probably benign right breast mass. Recommendation is for
continued sonographic follow-up at the time of the patient's annual
bilateral mammograms.

RECOMMENDATION:
Bilateral diagnostic mammogram and right breast ultrasound in 6
months.

I have discussed the findings and recommendations with the patient.
If applicable, a reminder letter will be sent to the patient
regarding the next appointment.

BI-RADS CATEGORY  3: Probably benign.

## 2022-07-04 ENCOUNTER — Encounter: Payer: Self-pay | Admitting: Acute Care

## 2022-07-04 ENCOUNTER — Ambulatory Visit (INDEPENDENT_AMBULATORY_CARE_PROVIDER_SITE_OTHER): Payer: Medicare HMO | Admitting: Acute Care

## 2022-07-04 VITALS — BP 146/82 | HR 97 | Temp 97.9°F | Ht 65.0 in | Wt 284.4 lb

## 2022-07-04 DIAGNOSIS — F17210 Nicotine dependence, cigarettes, uncomplicated: Secondary | ICD-10-CM | POA: Diagnosis not present

## 2022-07-04 DIAGNOSIS — J449 Chronic obstructive pulmonary disease, unspecified: Secondary | ICD-10-CM

## 2022-07-04 DIAGNOSIS — Z122 Encounter for screening for malignant neoplasm of respiratory organs: Secondary | ICD-10-CM | POA: Diagnosis not present

## 2022-07-04 DIAGNOSIS — F17200 Nicotine dependence, unspecified, uncomplicated: Secondary | ICD-10-CM

## 2022-07-04 DIAGNOSIS — R0902 Hypoxemia: Secondary | ICD-10-CM

## 2022-07-04 NOTE — Patient Instructions (Signed)
It is good to see you today. Please continue your Trelegy 1 puff once daily.  Rinse mouth after use Use albuterol as needed for breakthrough shortness of breath or wheezing Your oxygen saturations dropped to 88% This means you need your   Please work on quitting smoking  Call [facility] for free nicotine patches , gum and mints Do not use oxygen when you are smoking. Oxygen tanks can cause explode and cause bodily injury and burns.   Your oxygen levels did not drop below 88% today in the office.  We will do an overnight oximetry to see if you need oxygen at bedtime. Pt. needs to keep the home oxygen until after overnight oximetry is completed and read.  Follow up visit in 2 months after overnight oximetry to go over results.  Hypnosis for smoking cessation  [employer]. [phone]  Acupuncture for smoking cessation  [facility] [phone]    Follow up Low Dose CT Chest 01/2023 We will call you to get this scheduled Call if you need [place] sooner. Please contact office for sooner follow up if symptoms do not improve or worsen or seek emergency care

## 2022-07-04 NOTE — Progress Notes (Signed)
History of Present Illness Jenna Wood is a 61 y.o. female longstanding history of tobacco abuse since age 47, currently smokes 1.5 packs/day. Pack year smoking history of  84 years.She is followed by the lung cancer screening program and had an abnormal low-dose lung cancer screening CT 09/2021. PET scan showed no metabolic activity, and  Follow up Super D Chest  01/2022  showed stable RUL nodule. Next follow up is due 01/2022. Patient has past medical history of COPD, obesity, chronic pain, anxiety and depression.She is followed by Dr. Valeta Wood and the screening program.   07/04/2022 Pt. Presents today for oxygen re certification for [employer]. Patient wears oxygen at 2 L [place] for her chronic respiratory failure and COPD. She continues to smoke 2 packs of cigarettes per day. She smells very strongly of cigarettes. Pt. States she sleeps with her oxygen, she needs it for showering and on days when humidity is high. We have turned her oxygen off and she will walk today to qualify for her oxygen. She states she is at her baseline respiratory status. She is compliant with her Trelegy daily. She rarely has to use her albuterol inhaler. I have reminded her to make sure she does not use her oxygen while smoking.   Pt. Did not drop her saturations below 88% today with her walk . We will do an overnight oximetry to ensure she is not dropping her saturations at night. Oxygen needs to remain in the patient's home until the overnight oximetry has been completed and read. We will determine continued need for oxygen at that time.   I spoke with patient at length today about quitting smoking.She has been given the 1-800 QUIT NOW number, as well as numbers for hypnosis and acupuncture to see if these will help her with smoking cessation.   Test Results:     Latest Ref Rng & Units 06/21/2016    3:27 PM  CBC  Hemoglobin 12.0 - 15.0 g/dL 15.3   Hematocrit 36.0 - 46.0 % 45.0        Latest Ref Rng & Units 06/21/2016     3:27 PM  BMP  Glucose 65 - 99 mg/dL 92   BUN 6 - 20 mg/dL 6   Creatinine 0.44 - 1.00 mg/dL 0.80   Sodium 135 - 145 mmol/L 143   Potassium 3.5 - 5.1 mmol/L 3.6   Chloride 101 - 111 mmol/L 100     BNP No results found for: "BNP"  ProBNP No results found for: "PROBNP"  PFT No results found for: "FEV1PRE", "FEV1POST", "FVCPRE", "FVCPOST", "TLC", "DLCOUNC", "PREFEV1FVCRT", "PSTFEV1FVCRT"  No results found.   Past medical hx Past Medical History:  Diagnosis Date   Anxiety    Chronic back pain    COPD (chronic obstructive pulmonary disease) ([place])    Depression    Diabetes mellitus type 2, insulin dependent ([place]) 08/03/2021   DJD (degenerative joint disease)    Essential hypertension    Fibromyalgia    Gallstones    GERD (gastroesophageal reflux disease)    Hyperlipidemia    Migraine    Pancreatitis      Social History   Tobacco Use   Smoking status: Every Day    Packs/day: 2.00    Years: 42.00    Total pack years: 84.00    Types: Cigarettes    Start date: 88   Smokeless tobacco: Never   Tobacco comments:    Currently smokes 2 packs daily [facility] 07/04/22  Vaping Use  Vaping Use: Former   Start date: 11/10/2013   Quit date: 11/10/2014  Substance Use Topics   Alcohol use: No   Drug use: No    Ms.Jenna Wood reports that she has been smoking cigarettes. She started smoking about 43 years ago. She has a 84.00 pack-year smoking history. She has never used smokeless tobacco. She reports that she does not drink alcohol and does not use drugs.  Tobacco Cessation: Current every day smoker with an 84 pack year smoking history.   Past surgical hx, Family hx, Social hx all reviewed.  Current Outpatient Medications on File Prior to Visit  Medication Sig   Accu-Chek Softclix Lancets lancets SMARTSIG:Topical   acetaminophen (TYLENOL) 325 MG tablet Take 2 tablets (650 mg total) by mouth every 6 (six) hours as needed for mild pain or moderate pain.   albuterol (PROVENTIL  HFA;VENTOLIN HFA) 108 (90 BASE) MCG/ACT inhaler Inhale 2 puffs into the lungs every 4 (four) hours as needed for wheezing or shortness of breath.    atorvastatin (LIPITOR) 10 MG tablet Take 10 mg by mouth daily.   brexpiprazole (REXULTI) 2 MG TABS tablet Take 2 mg by mouth daily.   buPROPion (WELLBUTRIN) 100 MG tablet Take by mouth.   busPIRone (BUSPAR) 15 MG tablet Take 15 mg by mouth 3 (three) times daily.   cyclobenzaprine (FLEXERIL) 10 MG tablet Take 10 mg by mouth at bedtime.   DROPLET PEN NEEDLES 30G X 8 MM MISC    DULoxetine (CYMBALTA) 60 MG capsule Take 60 mg by mouth 2 (two) times daily.   ezetimibe (ZETIA) 10 MG tablet Take 10 mg by mouth daily.   Fluticasone-Umeclidin-Vilant (TRELEGY ELLIPTA) 200-62.5-25 MCG/ACT AEPB Inhale 1 puff into the lungs daily.   furosemide (LASIX) 40 MG tablet Take 40 mg by mouth daily.   ipratropium-albuterol (DUONEB) 0.5-2.5 (3) MG/3ML SOLN Take by nebulization.   JANUVIA 100 MG tablet Take by mouth.   LANTUS SOLOSTAR 100 UNIT/ML Solostar Pen Inject into the skin.   LYRICA 150 MG capsule Take 150 mg by mouth 2 (two) times daily.   metFORMIN (GLUCOPHAGE) 1000 MG tablet Take by mouth.   omeprazole (PRILOSEC) 20 MG capsule Take 30- 60 min before your first and last meals of the day (Patient taking differently: daily. Take 30- 60 min before your first and last meals of the day)   pentoxifylline (TRENTAL) 400 MG CR tablet Take 400 mg by mouth 3 (three) times daily.   potassium chloride SA (K-DUR,KLOR-CON) 20 MEQ tablet Take 20 mEq by mouth daily.   pramipexole (MIRAPEX) 1 MG tablet Take by mouth.   telmisartan (MICARDIS) 40 MG tablet Take 1 tablet (40 mg total) by mouth daily.   vortioxetine HBr (TRINTELLIX) 20 MG TABS tablet Take 20 mg by mouth daily.   No current facility-administered medications on file prior to visit.     No Known Allergies  Review Of Systems:  Constitutional:   No  weight loss, night sweats,  Fevers, chills, fatigue, or   lassitude.  HEENT:   No headaches,  Difficulty swallowing,  Tooth/dental problems, or  Sore throat,                No sneezing, itching, ear ache, nasal congestion, post nasal drip,   CV:  No chest pain,  Orthopnea, PND, swelling in lower extremities, anasarca, dizziness, palpitations, syncope.   GI  No heartburn, indigestion, abdominal pain, nausea, vomiting, diarrhea, change in bowel habits, loss of appetite, bloody stools.   Resp: + shortness  of breath with exertion or at rest.  + baseline excess mucus, no productive cough,  No non-productive cough,  No coughing up of blood.  No change in color of mucus.  No wheezing.  No chest wall deformity  Skin: no rash or lesions.  GU: no dysuria, change in color of urine, no urgency or frequency.  No flank pain, no hematuria   MS:  No joint pain or swelling.  No decreased range of motion.  No back pain.  Psych:  No change in mood or affect. No depression or anxiety.  No memory loss.   Vital Signs BP (!) 146/82 (BP Location: Right Arm, Patient Position: Sitting, Cuff Size: Large)   Pulse 97   Temp 97.9 F (36.6 C) (Oral)   Ht '5\' 5"'$  (1.651 m)   Wt 284 lb 6.4 oz (129 kg)   SpO2 99%   BMI 47.33 kg/m    Physical Exam:  General- No distress,  A&Ox3, pleasant , smells strongly of smoke. ENT: No sinus tenderness, TM clear, pale nasal mucosa, no oral exudate,no post nasal drip, no LAN Cardiac: S1, S2, regular rate and rhythm, no murmur Chest: No wheeze/ rales/ dullness; no accessory muscle use, no nasal flaring, no sternal retractions, diminished per bases  Abd.: Soft Non-tender, ND, BS +, Body mass index is 47.33 kg/m.  Ext: No clubbing cyanosis, edema Neuro:  normal strength, MAE x 4, A&O x 3 Skin: No rashes, warm and dry, no lesions, multiple tattoos Psych: normal mood and behavior   Assessment/Plan Chronic hypoxic respiratory failure I setting of COPD Currently on oxygen at 2 L [place] Saturations did not drop below 88% today in the  office with walk. Plan Please continue your Trelegy 1 puff once daily.  Rinse mouth after use Use albuterol as needed for breakthrough shortness of breath or wheezing Your oxygen saturations dropped to 88% This means you need your   Please work on quitting smoking  Call [facility] for free nicotine patches , gum and mints Do not use oxygen when you are smoking. Oxygen tanks can cause explode and cause bodily injury and burns.   Your oxygen levels did not drop below 88% today in the office.  We will do an overnight oximetry to see if you need oxygen at bedtime. Pt. needs to keep the home oxygen until after overnight oximetry is completed and read.  Follow up visit in 2 months after overnight oximetry to go over results.  Hypnosis for smoking cessation  [employer]. [phone]  Acupuncture for smoking cessation  [facility] [phone]    Follow up Low Dose CT Chest 01/2023 We will call you to get this scheduled Call if you need [place] sooner. Please contact office for sooner follow up if symptoms do not improve or worsen or seek emergency care    I spent 40 minutes dedicated to the care of this patient on the date of this encounter to include pre-visit review of records, face-to-face time with the patient discussing conditions above, post visit ordering of testing, clinical documentation with the electronic health record, making appropriate referrals as documented, and communicating necessary information to the patient's healthcare team.   Magdalen Spatz, NP 07/04/2022  9:16 PM

## 2022-07-21 ENCOUNTER — Other Ambulatory Visit: Payer: Self-pay

## 2022-07-21 ENCOUNTER — Emergency Department (HOSPITAL_COMMUNITY): Payer: Medicare HMO

## 2022-07-21 ENCOUNTER — Emergency Department (HOSPITAL_COMMUNITY)
Admission: EM | Admit: 2022-07-21 | Discharge: 2022-07-21 | Discharge status: Against Medical Advice | Payer: Medicare HMO | Attending: Emergency Medicine | Admitting: Emergency Medicine

## 2022-07-21 ENCOUNTER — Encounter (HOSPITAL_COMMUNITY): Payer: Self-pay

## 2022-07-21 DIAGNOSIS — R002 Palpitations: Secondary | ICD-10-CM | POA: Diagnosis not present

## 2022-07-21 DIAGNOSIS — R9431 Abnormal electrocardiogram [ECG] [EKG]: Secondary | ICD-10-CM | POA: Insufficient documentation

## 2022-07-21 DIAGNOSIS — Z5321 Procedure and treatment not carried out due to patient leaving prior to being seen by health care provider: Secondary | ICD-10-CM | POA: Insufficient documentation

## 2022-07-21 DIAGNOSIS — R42 Dizziness and giddiness: Secondary | ICD-10-CM | POA: Insufficient documentation

## 2022-07-21 LAB — CBC WITH DIFFERENTIAL/PLATELET
Abs Immature Granulocytes: 0.18 10*3/uL — ABNORMAL HIGH (ref 0.00–0.07)
Basophils Absolute: 0.1 10*3/uL (ref 0.0–0.1)
Basophils Relative: 1 %
Eosinophils Absolute: 0.3 10*3/uL (ref 0.0–0.5)
Eosinophils Relative: 2 %
HCT: 45.1 % (ref 36.0–46.0)
Hemoglobin: 14.7 g/dL (ref 12.0–15.0)
Immature Granulocytes: 1 %
Lymphocytes Relative: 22 %
Lymphs Abs: 4.1 10*3/uL — ABNORMAL HIGH (ref 0.7–4.0)
MCH: 31.4 pg (ref 26.0–34.0)
MCHC: 32.6 g/dL (ref 30.0–36.0)
MCV: 96.4 fL (ref 80.0–100.0)
Monocytes Absolute: 1.1 10*3/uL — ABNORMAL HIGH (ref 0.1–1.0)
Monocytes Relative: 6 %
Neutro Abs: 12.7 10*3/uL — ABNORMAL HIGH (ref 1.7–7.7)
Neutrophils Relative %: 68 %
Platelets: 349 10*3/uL (ref 150–400)
RBC: 4.68 MIL/uL (ref 3.87–5.11)
RDW: 15.2 % (ref 11.5–15.5)
WBC: 18.6 10*3/uL — ABNORMAL HIGH (ref 4.0–10.5)
nRBC: 0 % (ref 0.0–0.2)

## 2022-07-21 LAB — BASIC METABOLIC PANEL
Anion gap: 13 (ref 5–15)
BUN: 7 mg/dL (ref 6–20)
CO2: 24 mmol/L (ref 22–32)
Calcium: 8.8 mg/dL — ABNORMAL LOW (ref 8.9–10.3)
Chloride: 102 mmol/L (ref 98–111)
Creatinine, Ser: 0.93 mg/dL (ref 0.44–1.00)
GFR, Estimated: >60 mL/min (ref >60)
Glucose, Bld: 113 mg/dL — ABNORMAL HIGH (ref 70–99)
Potassium: 3.9 mmol/L (ref 3.5–5.1)
Sodium: 139 mmol/L (ref 135–145)

## 2022-07-21 LAB — TROPONIN I (HIGH SENSITIVITY): Troponin I (High Sensitivity): 10 ng/L (ref <18)

## 2022-07-21 NOTE — ED Provider Triage Note (Signed)
Emergency Medicine Provider Triage Evaluation Note  Jenna Wood , a 61 y.o. female  was evaluated in triage.  Pt complains of dizziness.  Dizziness has been ongoing since yesterday and is intermittent.  It was happening every couple of hours but is not happening every couple minutes where she would intermittently dizzy.  She is associated palpitations.  Seen by her PCP today who sent her here for an abnormal EKG changes.  Review of Systems  Positive:  Negative:   Physical Exam  BP 99/77   Pulse (!) 103   Temp 98.7 F (37.1 C) (Oral)   Resp 18   Ht '5\' 5"'$  (1.651 m)   Wt 128.8 kg   SpO2 96%   BMI 47.26 kg/m  Gen:   Awake, no distress   Resp:  Normal effort  MSK:   Moves extremities without difficulty  Other:  Irregular pulse, normal rate  Medical Decision Making  Medically screening exam initiated at 11:44 AM.  Appropriate orders placed.  Jenna Wood was informed that the remainder of the evaluation will be completed by another provider, this initial triage assessment does not replace that evaluation, and the importance of remaining in the ED until their evaluation is complete.     Jenna Birchwood, PA-C 07/21/22 1145

## 2022-07-21 NOTE — ED Triage Notes (Signed)
Periodic moments of vertigo was at PCP for physical PCP did EKG and said it was different from EKG from 3 years ago. Denies all other complaints.

## 2022-07-30 ENCOUNTER — Other Ambulatory Visit: Payer: Self-pay | Admitting: Family Medicine

## 2022-07-30 DIAGNOSIS — M62838 Other muscle spasm: Secondary | ICD-10-CM

## 2022-08-05 ENCOUNTER — Other Ambulatory Visit: Payer: Medicare HMO

## 2022-08-13 ENCOUNTER — Other Ambulatory Visit: Payer: Medicare HMO

## 2022-08-20 ENCOUNTER — Ambulatory Visit
Admission: RE | Admit: 2022-08-20 | Discharge: 2022-08-20 | Disposition: A | Discharge status: Home / Self Care | Payer: Medicare HMO | Source: Ambulatory Visit | Attending: Family Medicine | Admitting: Family Medicine

## 2022-08-20 DIAGNOSIS — M62838 Other muscle spasm: Secondary | ICD-10-CM

## 2022-08-21 ENCOUNTER — Ambulatory Visit: Payer: Medicare HMO | Admitting: Podiatry

## 2022-08-21 DIAGNOSIS — Q828 Other specified congenital malformations of skin: Secondary | ICD-10-CM | POA: Diagnosis not present

## 2022-08-21 DIAGNOSIS — M778 Other enthesopathies, not elsewhere classified: Secondary | ICD-10-CM | POA: Diagnosis not present

## 2022-08-21 MED ORDER — TRIAMCINOLONE ACETONIDE 10 MG/ML IJ SUSP
10.0000 mg | Freq: Once | INTRAMUSCULAR | Status: AC
  Administered 2022-08-21: 10 mg

## 2022-08-23 NOTE — Progress Notes (Signed)
Subjective:   Patient ID: Jenna Wood, female   DOB: 61 y.o.   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

## 2022-08-31 ENCOUNTER — Other Ambulatory Visit: Payer: Self-pay | Admitting: Pulmonary Disease

## 2022-10-30 ENCOUNTER — Other Ambulatory Visit: Payer: Self-pay | Admitting: *Deleted

## 2022-10-30 MED ORDER — TRELEGY ELLIPTA 200-62.5-25 MCG/ACT IN AEPB: 1.0000 | INHALATION_SPRAY | Freq: Every day | RESPIRATORY_TRACT | 3 refills | Status: DC

## 2023-01-05 ENCOUNTER — Other Ambulatory Visit: Payer: Self-pay

## 2023-01-05 MED ORDER — TRELEGY ELLIPTA 200-62.5-25 MCG/ACT IN AEPB: 1.0000 | INHALATION_SPRAY | Freq: Every day | RESPIRATORY_TRACT | 3 refills | Status: AC

## 2023-01-22 ENCOUNTER — Ambulatory Visit (HOSPITAL_COMMUNITY): Admission: RE | Admit: 2023-01-22 | Payer: Medicare PPO | Source: Ambulatory Visit

## 2023-02-04 ENCOUNTER — Ambulatory Visit (INDEPENDENT_AMBULATORY_CARE_PROVIDER_SITE_OTHER): Payer: Medicare PPO | Admitting: Podiatry

## 2023-02-04 ENCOUNTER — Encounter: Payer: Self-pay | Admitting: Podiatry

## 2023-02-04 DIAGNOSIS — Q828 Other specified congenital malformations of skin: Secondary | ICD-10-CM | POA: Diagnosis not present

## 2023-02-04 DIAGNOSIS — E1140 Type 2 diabetes mellitus with diabetic neuropathy, unspecified: Secondary | ICD-10-CM | POA: Diagnosis not present

## 2023-02-04 DIAGNOSIS — E1149 Type 2 diabetes mellitus with other diabetic neurological complication: Secondary | ICD-10-CM

## 2023-02-04 NOTE — Progress Notes (Signed)
Subjective:   Patient ID: Jenna Wood, female   DOB: 62 y.o.   MRN: [id number]   HPI Patient presents with lesions on the bottom of both feet which get very sore and has lucent type core worse with patient on the long-term diabetes obesity and smokes 2 packs a day   ROS      Objective:  Physical Exam  Neurovascular unchanged with severe lesion x 2 of both feet that are painful when pressed make shoe gear difficult with diabetes with neurological issues     Assessment:  Long-term at risk patient with chronic lesion formation bilateral     Plan:  H&P debridement accomplished there was some areas of blood just because the skin is so thin and I applied dressings with Neosporin and patient will be seen back to recheck as needed

## 2023-03-12 DIAGNOSIS — Z1231 Encounter for screening mammogram for malignant neoplasm of breast: Secondary | ICD-10-CM | POA: Diagnosis not present

## 2023-03-12 DIAGNOSIS — E118 Type 2 diabetes mellitus with unspecified complications: Secondary | ICD-10-CM | POA: Diagnosis not present

## 2023-03-12 DIAGNOSIS — Z124 Encounter for screening for malignant neoplasm of cervix: Secondary | ICD-10-CM | POA: Diagnosis not present

## 2023-03-12 DIAGNOSIS — Z0000 Encounter for general adult medical examination without abnormal findings: Secondary | ICD-10-CM | POA: Diagnosis not present

## 2023-03-12 DIAGNOSIS — Z1211 Encounter for screening for malignant neoplasm of colon: Secondary | ICD-10-CM | POA: Diagnosis not present

## 2023-03-12 DIAGNOSIS — E559 Vitamin D deficiency, unspecified: Secondary | ICD-10-CM | POA: Diagnosis not present

## 2023-03-12 DIAGNOSIS — E785 Hyperlipidemia, unspecified: Secondary | ICD-10-CM | POA: Diagnosis not present

## 2023-03-12 DIAGNOSIS — L219 Seborrheic dermatitis, unspecified: Secondary | ICD-10-CM | POA: Diagnosis not present

## 2023-03-12 DIAGNOSIS — Z122 Encounter for screening for malignant neoplasm of respiratory organs: Secondary | ICD-10-CM | POA: Diagnosis not present

## 2023-03-19 ENCOUNTER — Ambulatory Visit (HOSPITAL_COMMUNITY)
Admission: RE | Admit: 2023-03-19 | Discharge: 2023-03-19 | Disposition: A | Discharge status: Home / Self Care | Payer: Medicare HMO | Source: Ambulatory Visit | Attending: Acute Care | Admitting: Acute Care

## 2023-03-19 DIAGNOSIS — F17210 Nicotine dependence, cigarettes, uncomplicated: Secondary | ICD-10-CM | POA: Insufficient documentation

## 2023-03-19 DIAGNOSIS — Z122 Encounter for screening for malignant neoplasm of respiratory organs: Secondary | ICD-10-CM | POA: Diagnosis not present

## 2023-03-19 DIAGNOSIS — Z87891 Personal history of nicotine dependence: Secondary | ICD-10-CM | POA: Diagnosis not present

## 2023-04-08 DIAGNOSIS — N6315 Unspecified lump in the right breast, overlapping quadrants: Secondary | ICD-10-CM | POA: Diagnosis not present

## 2023-04-08 DIAGNOSIS — R928 Other abnormal and inconclusive findings on diagnostic imaging of breast: Secondary | ICD-10-CM | POA: Diagnosis not present

## 2023-04-21 DIAGNOSIS — H524 Presbyopia: Secondary | ICD-10-CM | POA: Diagnosis not present

## 2023-04-21 DIAGNOSIS — H52223 Regular astigmatism, bilateral: Secondary | ICD-10-CM | POA: Diagnosis not present

## 2023-04-21 DIAGNOSIS — E119 Type 2 diabetes mellitus without complications: Secondary | ICD-10-CM | POA: Diagnosis not present

## 2023-05-28 ENCOUNTER — Other Ambulatory Visit: Payer: Self-pay | Admitting: *Deleted

## 2023-05-28 DIAGNOSIS — Z87891 Personal history of nicotine dependence: Secondary | ICD-10-CM

## 2023-05-28 DIAGNOSIS — F17210 Nicotine dependence, cigarettes, uncomplicated: Secondary | ICD-10-CM

## 2023-05-28 DIAGNOSIS — Z122 Encounter for screening for malignant neoplasm of respiratory organs: Secondary | ICD-10-CM

## 2023-07-10 DIAGNOSIS — R059 Cough, unspecified: Secondary | ICD-10-CM | POA: Diagnosis not present

## 2023-07-10 DIAGNOSIS — R0602 Shortness of breath: Secondary | ICD-10-CM | POA: Diagnosis not present

## 2023-07-10 DIAGNOSIS — R5383 Other fatigue: Secondary | ICD-10-CM | POA: Diagnosis not present

## 2023-07-10 DIAGNOSIS — Z20822 Contact with and (suspected) exposure to covid-19: Secondary | ICD-10-CM | POA: Diagnosis not present

## 2023-07-20 DIAGNOSIS — E785 Hyperlipidemia, unspecified: Secondary | ICD-10-CM | POA: Diagnosis not present

## 2023-07-20 DIAGNOSIS — E669 Obesity, unspecified: Secondary | ICD-10-CM | POA: Diagnosis not present

## 2023-07-20 DIAGNOSIS — I10 Essential (primary) hypertension: Secondary | ICD-10-CM | POA: Diagnosis not present

## 2023-07-20 DIAGNOSIS — Z7182 Exercise counseling: Secondary | ICD-10-CM | POA: Diagnosis not present

## 2023-07-20 DIAGNOSIS — Z713 Dietary counseling and surveillance: Secondary | ICD-10-CM | POA: Diagnosis not present

## 2023-07-20 DIAGNOSIS — E118 Type 2 diabetes mellitus with unspecified complications: Secondary | ICD-10-CM | POA: Diagnosis not present

## 2023-07-30 DIAGNOSIS — M779 Enthesopathy, unspecified: Secondary | ICD-10-CM | POA: Diagnosis not present

## 2023-07-30 DIAGNOSIS — M79671 Pain in right foot: Secondary | ICD-10-CM | POA: Diagnosis not present

## 2023-08-19 ENCOUNTER — Other Ambulatory Visit: Payer: Self-pay | Admitting: Pulmonary Disease

## 2023-10-06 DIAGNOSIS — Z794 Long term (current) use of insulin: Secondary | ICD-10-CM | POA: Diagnosis not present

## 2023-10-06 DIAGNOSIS — E118 Type 2 diabetes mellitus with unspecified complications: Secondary | ICD-10-CM | POA: Diagnosis not present

## 2023-10-28 ENCOUNTER — Encounter: Payer: Self-pay | Admitting: Podiatry

## 2023-10-28 ENCOUNTER — Ambulatory Visit (INDEPENDENT_AMBULATORY_CARE_PROVIDER_SITE_OTHER): Payer: Medicare HMO | Admitting: Podiatry

## 2023-10-28 VITALS — Ht 65.0 in | Wt 284.0 lb

## 2023-10-28 DIAGNOSIS — M79675 Pain in left toe(s): Secondary | ICD-10-CM

## 2023-10-28 DIAGNOSIS — E1140 Type 2 diabetes mellitus with diabetic neuropathy, unspecified: Secondary | ICD-10-CM

## 2023-10-28 DIAGNOSIS — M79674 Pain in right toe(s): Secondary | ICD-10-CM

## 2023-10-28 DIAGNOSIS — B351 Tinea unguium: Secondary | ICD-10-CM | POA: Diagnosis not present

## 2023-10-28 DIAGNOSIS — Q828 Other specified congenital malformations of skin: Secondary | ICD-10-CM

## 2023-10-28 DIAGNOSIS — E1149 Type 2 diabetes mellitus with other diabetic neurological complication: Secondary | ICD-10-CM

## 2023-10-29 NOTE — Progress Notes (Signed)
Subjective:   Patient ID: Candee Furbish, female   DOB: 62 y.o.   MRN: [id number]   HPI Patient presents stating that she has very painful calluses on both feet and has nail disease 1-5 both feet thick that she cannot cut and become painful   ROS      Objective:  Physical Exam  Neurovascular unchanged with diminishment of sharp dull vibratory long-term diabetes smokes 2 packs/day and has significant lesions bilateral fifth and first metatarsal painful with thick yellow brittle nailbeds 1-5 both feet that are painful when pressed     Assessment:  High risk individual with chronic painful lesion formation and nail disease with pain 1-5 both feet mycotic component     Plan:  H&P all conditions reviewed debridement of nailbeds debridement of lesions no iatrogenic bleeding reappoint for routine care

## 2023-11-02 DIAGNOSIS — I10 Essential (primary) hypertension: Secondary | ICD-10-CM | POA: Diagnosis not present

## 2023-11-02 DIAGNOSIS — Z23 Encounter for immunization: Secondary | ICD-10-CM | POA: Diagnosis not present

## 2023-11-02 DIAGNOSIS — E118 Type 2 diabetes mellitus with unspecified complications: Secondary | ICD-10-CM | POA: Diagnosis not present

## 2023-11-02 DIAGNOSIS — Z7182 Exercise counseling: Secondary | ICD-10-CM | POA: Diagnosis not present

## 2023-11-02 DIAGNOSIS — E669 Obesity, unspecified: Secondary | ICD-10-CM | POA: Diagnosis not present

## 2023-11-02 DIAGNOSIS — F418 Other specified anxiety disorders: Secondary | ICD-10-CM | POA: Diagnosis not present

## 2023-11-02 DIAGNOSIS — R519 Headache, unspecified: Secondary | ICD-10-CM | POA: Diagnosis not present

## 2023-11-02 DIAGNOSIS — R252 Cramp and spasm: Secondary | ICD-10-CM | POA: Diagnosis not present

## 2023-11-02 DIAGNOSIS — E785 Hyperlipidemia, unspecified: Secondary | ICD-10-CM | POA: Diagnosis not present

## 2023-11-05 DIAGNOSIS — Z794 Long term (current) use of insulin: Secondary | ICD-10-CM | POA: Diagnosis not present

## 2023-11-05 DIAGNOSIS — E118 Type 2 diabetes mellitus with unspecified complications: Secondary | ICD-10-CM | POA: Diagnosis not present

## 2023-11-06 ENCOUNTER — Other Ambulatory Visit: Payer: Self-pay | Admitting: Medical Genetics

## 2023-11-10 ENCOUNTER — Other Ambulatory Visit (HOSPITAL_COMMUNITY): Payer: Medicare HMO

## 2023-11-16 ENCOUNTER — Other Ambulatory Visit (HOSPITAL_COMMUNITY): Payer: Medicaid Other | Attending: Medical Genetics

## 2023-12-09 DIAGNOSIS — Z794 Long term (current) use of insulin: Secondary | ICD-10-CM | POA: Diagnosis not present

## 2023-12-09 DIAGNOSIS — E118 Type 2 diabetes mellitus with unspecified complications: Secondary | ICD-10-CM | POA: Diagnosis not present

## 2023-12-14 DIAGNOSIS — E118 Type 2 diabetes mellitus with unspecified complications: Secondary | ICD-10-CM | POA: Diagnosis not present

## 2023-12-14 DIAGNOSIS — F418 Other specified anxiety disorders: Secondary | ICD-10-CM | POA: Diagnosis not present

## 2023-12-14 DIAGNOSIS — E669 Obesity, unspecified: Secondary | ICD-10-CM | POA: Diagnosis not present

## 2023-12-14 DIAGNOSIS — I10 Essential (primary) hypertension: Secondary | ICD-10-CM | POA: Diagnosis not present

## 2023-12-14 DIAGNOSIS — Z7182 Exercise counseling: Secondary | ICD-10-CM | POA: Diagnosis not present

## 2023-12-14 DIAGNOSIS — E785 Hyperlipidemia, unspecified: Secondary | ICD-10-CM | POA: Diagnosis not present

## 2023-12-14 DIAGNOSIS — Z713 Dietary counseling and surveillance: Secondary | ICD-10-CM | POA: Diagnosis not present

## 2024-01-12 DIAGNOSIS — E118 Type 2 diabetes mellitus with unspecified complications: Secondary | ICD-10-CM | POA: Diagnosis not present

## 2024-01-12 DIAGNOSIS — Z794 Long term (current) use of insulin: Secondary | ICD-10-CM | POA: Diagnosis not present

## 2024-04-23 ENCOUNTER — Ambulatory Visit (HOSPITAL_COMMUNITY): Admission: RE | Admit: 2024-04-23 | Source: Ambulatory Visit

## 2024-09-02 ENCOUNTER — Other Ambulatory Visit: Payer: Self-pay | Admitting: Medical Genetics

## 2024-09-02 DIAGNOSIS — Z006 Encounter for examination for normal comparison and control in clinical research program: Secondary | ICD-10-CM

## 2024-09-27 LAB — GENECONNECT MOLECULAR SCREEN: Genetic Analysis Overall Interpretation: NEGATIVE
